# Patient Record
Sex: Female | Born: 1995 | Race: Black or African American | Hispanic: No | Marital: Single | Smoking: Never smoker
Health system: Southern US, Community
[De-identification: ages and names within clinical notes are randomized; demographics above are authoritative.]

## PROBLEM LIST (undated history)

## (undated) DIAGNOSIS — L0591 Pilonidal cyst without abscess: Secondary | ICD-10-CM

## (undated) DIAGNOSIS — Z8619 Personal history of other infectious and parasitic diseases: Secondary | ICD-10-CM

## (undated) DIAGNOSIS — A749 Chlamydial infection, unspecified: Secondary | ICD-10-CM

## (undated) HISTORY — DX: Personal history of other infectious and parasitic diseases: Z86.19

## (undated) HISTORY — DX: Pilonidal cyst without abscess: L05.91

## (undated) HISTORY — DX: Chlamydial infection, unspecified: A74.9

---
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
## 2017-06-01 ENCOUNTER — Ambulatory Visit (INDEPENDENT_AMBULATORY_CARE_PROVIDER_SITE_OTHER): Payer: BC Managed Care – PPO | Admitting: Obstetrics and Gynecology

## 2017-06-01 ENCOUNTER — Encounter: Payer: Self-pay | Admitting: Obstetrics and Gynecology

## 2017-06-01 VITALS — BP 109/75 | HR 75 | Ht 62.5 in | Wt 108.0 lb

## 2017-06-01 DIAGNOSIS — Z3009 Encounter for other general counseling and advice on contraception: Secondary | ICD-10-CM

## 2017-06-01 DIAGNOSIS — Z30011 Encounter for initial prescription of contraceptive pills: Secondary | ICD-10-CM

## 2017-06-01 MED ORDER — NORETHIN-ETH ESTRAD-FE BIPHAS 1 MG-10 MCG / 10 MCG PO TABS: 1.0000 | ORAL_TABLET | Freq: Every day | ORAL | 11 refills | Status: DC

## 2017-06-01 NOTE — Progress Notes (Signed)
21 yo G0 here for contraception counseling. Patient has been sexually active in the past but not currently. She has a history of chlamydia. She denies any pelvic pain or abnormal discharge  Past Medical History:  Diagnosis Date  . Ankle sprain   . History of chlamydia    History reviewed. No pertinent surgical history. Family History  Problem Relation Age of Onset  . Diabetes Paternal Grandfather   . Diabetes Paternal Grandmother   . Diabetes Maternal Grandmother    Social History  Substance Use Topics  . Smoking status: Never Smoker  . Smokeless tobacco: Never Used  . Alcohol use No   ROS See pertinent in HPI  Blood pressure 109/75, pulse 75, height 5' 2.5" (1.588 m), weight 108 lb (49 kg), last menstrual period 05/27/2017. GENERAL: Well-developed, well-nourished female in no acute distress.  NEURO: Alert and oriented x 3  A/P 21 yo here for contraception counseling - Discussed different birth control options - Patient opted for OCP. Sample for Lo Loestrin provided. Rx with refills also given - Advised patient to use condoms with every sexual encounter for STD prevention - pap smear at 41

## 2017-06-01 NOTE — Progress Notes (Signed)
Pt states she had +CH results from primary. Pt states was tx. Pt has f/u with primary this week for testing. Pt states that her primary has done routine exam for her this year.  Pt was advised to see our office in order to discuss birth control.

## 2017-06-01 NOTE — Patient Instructions (Signed)
Contraception Choices Contraception (birth control) is the use of any methods or devices to prevent pregnancy. Below are some methods to help avoid pregnancy. Hormonal methods  Contraceptive implant. This is a thin, plastic tube containing progesterone hormone. It does not contain estrogen hormone. Your health care provider inserts the tube in the inner part of the upper arm. The tube can remain in place for up to 3 years. After 3 years, the implant must be removed. The implant prevents the ovaries from releasing an egg (ovulation), thickens the cervical mucus to prevent sperm from entering the uterus, and thins the lining of the inside of the uterus.  Progesterone-only injections. These injections are given every 3 months by your health care provider to prevent pregnancy. This synthetic progesterone hormone stops the ovaries from releasing eggs. It also thickens cervical mucus and changes the uterine lining. This makes it harder for sperm to survive in the uterus.  Birth control pills. These pills contain estrogen and progesterone hormone. They work by preventing the ovaries from releasing eggs (ovulation). They also cause the cervical mucus to thicken, preventing the sperm from entering the uterus. Birth control pills are prescribed by a health care provider.Birth control pills can also be used to treat heavy periods.  Minipill. This type of birth control pill contains only the progesterone hormone. They are taken every day of each month and must be prescribed by your health care provider.  Birth control patch. The patch contains hormones similar to those in birth control pills. It must be changed once a week and is prescribed by a health care provider.  Vaginal ring. The ring contains hormones similar to those in birth control pills. It is left in the vagina for 3 weeks, removed for 1 week, and then a new one is put back in place. The patient must be comfortable inserting and removing the ring from  the vagina.A health care provider's prescription is necessary.  Emergency contraception. Emergency contraceptives prevent pregnancy after unprotected sexual intercourse. This pill can be taken right after sex or up to 5 days after unprotected sex. It is most effective the sooner you take the pills after having sexual intercourse. Most emergency contraceptive pills are available without a prescription. Check with your pharmacist. Do not use emergency contraception as your only form of birth control. Barrier methods  Female condom. This is a thin sheath (latex or rubber) that is worn over the penis during sexual intercourse. It can be used with spermicide to increase effectiveness.  Female condom. This is a soft, loose-fitting sheath that is put into the vagina before sexual intercourse.  Diaphragm. This is a soft, latex, dome-shaped barrier that must be fitted by a health care provider. It is inserted into the vagina, along with a spermicidal jelly. It is inserted before intercourse. The diaphragm should be left in the vagina for 6 to 8 hours after intercourse.  Cervical cap. This is a round, soft, latex or plastic cup that fits over the cervix and must be fitted by a health care provider. The cap can be left in place for up to 48 hours after intercourse.  Sponge. This is a soft, circular piece of polyurethane foam. The sponge has spermicide in it. It is inserted into the vagina after wetting it and before sexual intercourse.  Spermicides. These are chemicals that kill or block sperm from entering the cervix and uterus. They come in the form of creams, jellies, suppositories, foam, or tablets. They do not require a prescription. They   are inserted into the vagina with an applicator before having sexual intercourse. The process must be repeated every time you have sexual intercourse. Intrauterine contraception  Intrauterine device (IUD). This is a T-shaped device that is put in a woman's uterus during  a menstrual period to prevent pregnancy. There are 2 types: ? Copper IUD. This type of IUD is wrapped in copper wire and is placed inside the uterus. Copper makes the uterus and fallopian tubes produce a fluid that kills sperm. It can stay in place for 10 years. ? Hormone IUD. This type of IUD contains the hormone progestin (synthetic progesterone). The hormone thickens the cervical mucus and prevents sperm from entering the uterus, and it also thins the uterine lining to prevent implantation of a fertilized egg. The hormone can weaken or kill the sperm that get into the uterus. It can stay in place for 3-5 years, depending on which type of IUD is used. Permanent methods of contraception  Female tubal ligation. This is when the woman's fallopian tubes are surgically sealed, tied, or blocked to prevent the egg from traveling to the uterus.  Hysteroscopic sterilization. This involves placing a small coil or insert into each fallopian tube. Your doctor uses a technique called hysteroscopy to do the procedure. The device causes scar tissue to form. This results in permanent blockage of the fallopian tubes, so the sperm cannot fertilize the egg. It takes about 3 months after the procedure for the tubes to become blocked. You must use another form of birth control for these 3 months.  Female sterilization. This is when the female has the tubes that carry sperm tied off (vasectomy).This blocks sperm from entering the vagina during sexual intercourse. After the procedure, the man can still ejaculate fluid (semen). Natural planning methods  Natural family planning. This is not having sexual intercourse or using a barrier method (condom, diaphragm, cervical cap) on days the woman could become pregnant.  Calendar method. This is keeping track of the length of each menstrual cycle and identifying when you are fertile.  Ovulation method. This is avoiding sexual intercourse during ovulation.  Symptothermal method.  This is avoiding sexual intercourse during ovulation, using a thermometer and ovulation symptoms.  Post-ovulation method. This is timing sexual intercourse after you have ovulated. Regardless of which type or method of contraception you choose, it is important that you use condoms to protect against the transmission of sexually transmitted infections (STIs). Talk with your health care provider about which form of contraception is most appropriate for you. This information is not intended to replace advice given to you by your health care provider. Make sure you discuss any questions you have with your health care provider. Document Released: 12/08/2005 Document Revised: 05/15/2016 Document Reviewed: 06/02/2013 Elsevier Interactive Patient Education  2017 [employer].  

## 2017-06-03 DIAGNOSIS — A749 Chlamydial infection, unspecified: Secondary | ICD-10-CM

## 2017-06-03 HISTORY — DX: Chlamydial infection, unspecified: A74.9

## 2017-06-25 ENCOUNTER — Telehealth: Payer: Self-pay | Admitting: *Deleted

## 2017-06-25 NOTE — Telephone Encounter (Signed)
Pt came in to office today with questions about birth control.  Pt states that she has still been cramping with current pills.  Pt states that her cramping has been about the same if not worse at times since starting on BC pills. Pt also states that she has missed some pills in this pack and has started what seems to be a cycle. Pt advised of importance to take everyday in order to keep cycle regulated.  Pt advised to have cycle week this week, due to number of pills missed, and start new pack on Sunday.    Pt made aware that message will be sent to provider for any further recommendations.   Please advise.

## 2017-07-06 ENCOUNTER — Ambulatory Visit: Payer: BC Managed Care – PPO | Admitting: Obstetrics & Gynecology

## 2018-01-01 ENCOUNTER — Telehealth: Payer: Self-pay | Admitting: Family Medicine

## 2018-01-01 NOTE — Telephone Encounter (Signed)
Pt. Called wanting to schedule appointment with a new PCP. Appointment made with Dr. Juleen China. Reports she was recently seen and treated for pilonidal cyst.

## 2018-01-05 ENCOUNTER — Ambulatory Visit: Payer: BC Managed Care – PPO | Admitting: Family Medicine

## 2018-01-08 ENCOUNTER — Ambulatory Visit: Payer: BC Managed Care – PPO | Admitting: Family Medicine

## 2018-01-08 ENCOUNTER — Encounter: Payer: Self-pay | Admitting: Family Medicine

## 2018-01-08 VITALS — BP 104/68 | HR 79 | Temp 98.2°F | Ht 62.5 in | Wt 115.4 lb

## 2018-01-08 DIAGNOSIS — L0591 Pilonidal cyst without abscess: Secondary | ICD-10-CM

## 2018-01-08 MED ORDER — MUPIROCIN 2 % EX OINT: 1.0000 "application " | TOPICAL_OINTMENT | Freq: Three times a day (TID) | CUTANEOUS | 0 refills | Status: DC

## 2018-01-08 MED ORDER — SULFAMETHOXAZOLE-TRIMETHOPRIM 800-160 MG PO TABS
1.0000 | ORAL_TABLET | Freq: Two times a day (BID) | ORAL | 0 refills | Status: DC
Start: 2018-01-08 — End: 2019-01-05

## 2018-01-08 NOTE — Patient Instructions (Addendum)
Take antibiotic 1 tablet by mouth with food twice a day.  Apply ointment to area three times a day. Do not use any over the counter creams on the area until healed.

## 2018-01-10 ENCOUNTER — Encounter: Payer: Self-pay | Admitting: Family Medicine

## 2018-01-10 NOTE — Progress Notes (Signed)
Olivia Herrera is a 22 y.o. female is here to [facility].   Patient Care Team: Briscoe Deutscher, DO as PCP - General (Family Medicine)   History of Present Illness:   HPI: See Assessment and Plan section for Problem Based Charting of issues discussed today.  Health Maintenance Due  Topic Date Due  . HIV Screening  12/12/2013  . INFLUENZA VACCINE  07/22/2017   Depression screen PHQ 2/9 01/08/2018  Decreased Interest 0  Down, Depressed, Hopeless 0  PHQ - 2 Score 0   PMHx, SurgHx, SocialHx, Medications, and Allergies were reviewed in the Visit Navigator and updated as appropriate.   Past Medical History:  Diagnosis Date  . Ankle sprain   . History of chlamydia    History reviewed. No pertinent surgical history. Family History  Problem Relation Age of Onset  . Alcohol abuse Father   . Diabetes Paternal Grandfather   . Diabetes Paternal Grandmother   . Diabetes Maternal Grandmother    Social History   Tobacco Use  . Smoking status: Never Smoker  . Smokeless tobacco: Never Used  Substance Use Topics  . Alcohol use: No  . Drug use: No   Current Medications and Allergies:   .  Norethindrone-Ethinyl Estradiol-Fe Biphas (LO LOESTRIN FE) 1 MG-10 MCG / 10 MCG tablet, Take 1 tablet by mouth daily., Disp: 1 Package, Rfl: 11  No Known Allergies   Review of Systems:   Pertinent items are noted in the HPI. Otherwise, ROS is negative.  Vitals:   Vitals:   01/08/18 1250  BP: 104/68  Pulse: 79  Temp: 98.2 F (36.8 C)  TempSrc: Oral  SpO2: 100%  Weight: 115 lb 6.4 oz (52.3 kg)  Height: 5' 2.5" (1.588 m)     Body mass index is 20.77 kg/m.  Physical Exam:   Physical Exam  Constitutional: She is oriented to person, place, and time. She appears well-developed and well-nourished. No distress.  HENT:  Head: Normocephalic and atraumatic.  Right Ear: External ear normal.  Left Ear: External ear normal.  Nose: Nose normal.  Mouth/Throat: Oropharynx is clear and  moist.  Eyes: Conjunctivae and EOM are normal. Pupils are equal, round, and reactive to light.  Neck: Normal range of motion. Neck supple. No thyromegaly present.  Cardiovascular: Normal rate, regular rhythm, normal heart sounds and intact distal pulses.  Pulmonary/Chest: Effort normal and breath sounds normal.  Abdominal: Soft. Bowel sounds are normal.  Musculoskeletal: Normal range of motion.  Lymphadenopathy:    She has no cervical adenopathy.  Neurological: She is alert and oriented to person, place, and time.  Skin: Skin is warm and dry. Capillary refill takes less than 2 seconds.  Pilonidal cyst palpated, indurated, with skin changes c/w neosporin irritation. No draining, fluctuance, streaking.  Psychiatric: She has a normal mood and affect. Her behavior is normal.  Nursing note and vitals reviewed.  Assessment and Plan:   1. Infected pilonidal cyst Patient diagnosed with a pilonidal cyst two weeks ago. She was prescribed Septra. Noted some improvement with the antibiotic plus warm baths. She continues to have some discomfort and still notes an enlarged area at her coccyx. No longer draining. Finished septra. Otherwise, ROS is negative.   On exam, indurated area is still palpated. Overlying skin is irritated, possible from Neosporin used by patient. Will continue antibiotic x 7 days. Mupirocin provided. Continue moist heat to area. Red flags reviewed.    - mupirocin ointment (BACTROBAN) 2 %; Place 1 application into the  nose 3 (three) times daily.  Dispense: 22 g; Refill: 0 - sulfamethoxazole-trimethoprim (BACTRIM DS,SEPTRA DS) 800-160 MG tablet; Take 1 tablet by mouth 2 (two) times daily.  Dispense: 14 tablet; Refill: 0   . Reviewed expectations re: course of current medical issues. . Discussed self-management of symptoms. . Outlined signs and symptoms indicating need for more acute intervention. . Patient verbalized understanding and all questions were answered. Marland Kitchen Health  Maintenance issues including appropriate healthy diet, exercise, and smoking avoidance were discussed with patient. . See orders for this visit as documented in the electronic medical record. . Patient received an After Visit Summary.  Briscoe Deutscher, DO [place], [facility] [place] [facility] 01/10/2018

## 2018-01-12 ENCOUNTER — Ambulatory Visit: Payer: Self-pay | Admitting: *Deleted

## 2018-01-12 NOTE — Telephone Encounter (Signed)
Patient had headache on Saturday- noticed she was tired and her eyes were red- the next morning the headache was gone- but her eyes were still red. Patient reports her eyes are some better- not as red today. 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 She does not have drainage from her eyes- no itching.  Reason for Disposition . [1] Red eye AND [2] no blurred vision AND [3] minimal or no pain  Answer Assessment - Initial Assessment Questions 1. LOCATION: Location: "What's red, the eyeball or the outer eyelids?" (Note: when callers say the eye is red, they usually mean the sclera is red)       Sclera- white area of eye 2. REDNESS OF SCLERA: "Is the redness in one or both eyes?" "When did the redness start?"      Both eyes- Saturday 3. ONSET: "When did the eye become red?" (e.g., hours, days)      Saturday  4. EYELIDS: "Are the eyelids red or swollen?" If so, ask: "How much?"      no 5. VISION: "Is there any difficulty seeing clearly?"      No- patient feels like eyes are fatigued 6. ITCHING: "Does it feel itchy?" If so ask: "How bad is it" (e.g., Scale 1-10; or mild, moderate, severe)     No- more irritated/strained 7. PAIN: "Is there any pain? If so, ask: "How bad is it?" (e.g., Scale 1-10; or mild, moderate, severe)     no 8. CONTACT LENS: "Do you wear contacts?"     no 9. CAUSE: "What do you think is causing the redness?"     Patient thinks she may have more eye strain than anything 10. OTHER SYMPTOMS: "Do you have any other symptoms?" (e.g., fever, runny nose, cough, vomiting)       No- patient works outside  Protocols used: EYE - RED WITHOUT PUS-A-AH

## 2018-01-14 ENCOUNTER — Ambulatory Visit: Payer: BC Managed Care – PPO | Admitting: Family Medicine

## 2018-01-14 NOTE — Progress Notes (Deleted)
   Olivia Herrera is a 22 y.o. female here for an acute visit.  History of Present Illness:   HPI:   PMHx, SurgHx, SocialHx, Medications, and Allergies were reviewed in the Visit Navigator and updated as appropriate.  Current Medications:   .  Norethindrone-Ethinyl Estradiol-Fe Biphas (LO LOESTRIN FE) 1 MG-10 MCG / 10 MCG tablet, Take 1 tablet by mouth daily., Disp: 1 Package, Rfl: 11  No Known Allergies   Review of Systems:   Pertinent items are noted in the HPI. Otherwise, ROS is negative.  Vitals:  There were no vitals filed for this visit.   There is no height or weight on file to calculate BMI.  Physical Exam:   Physical Exam   Assessment and Plan:   ***

## 2018-01-18 ENCOUNTER — Encounter: Payer: Self-pay | Admitting: Family Medicine

## 2018-04-20 ENCOUNTER — Other Ambulatory Visit: Payer: Self-pay | Admitting: Obstetrics and Gynecology

## 2019-01-05 ENCOUNTER — Ambulatory Visit: Payer: BC Managed Care – PPO | Admitting: Family Medicine

## 2019-01-05 ENCOUNTER — Encounter: Payer: Self-pay | Admitting: Family Medicine

## 2019-01-05 VITALS — BP 110/64 | HR 78 | Temp 98.4°F | Ht 62.5 in | Wt 115.6 lb

## 2019-01-05 DIAGNOSIS — B373 Candidiasis of vulva and vagina: Secondary | ICD-10-CM

## 2019-01-05 DIAGNOSIS — B3731 Acute candidiasis of vulva and vagina: Secondary | ICD-10-CM

## 2019-01-05 MED ORDER — FLUCONAZOLE 150 MG PO TABS: 150.0000 mg | ORAL_TABLET | Freq: Once | ORAL | 0 refills | Status: AC

## 2019-01-05 NOTE — Progress Notes (Signed)
   Olivia Herrera is a 23 y.o. female here for an acute visit.  History of Present Illness:   Lonell Grandchild, CMA acting as scribe for Dr. Briscoe Deutscher.   HPI: Patent in for evaluation for vaginal itching for three days. She finished treatment for BV on 12/14/18. She was tested and treated and tested at the [facility]. She started having vaginal itching with brown to yellow discharge and odor about three days ago. She has had unprotected sex after treatment.   PMHx, SurgHx, SocialHx, Medications, and Allergies were reviewed in the Visit Navigator and updated as appropriate.  Current Medications:  No current outpatient medications on file.   No Known Allergies Review of Systems:   Pertinent items are noted in the HPI. Otherwise, ROS is negative.  Vitals:   Vitals:   01/05/19 1131  BP: 110/64  Pulse: 78  Temp: 98.4 F (36.9 C)  TempSrc: Oral  SpO2: 100%  Weight: 115 lb 9.6 oz (52.4 kg)  Height: 5' 2.5" (1.588 m)     Body mass index is 20.81 kg/m.  Physical Exam:   Physical Exam Vitals signs and nursing note reviewed.  HENT:     Head: Normocephalic and atraumatic.  Eyes:     Pupils: Pupils are equal, round, and reactive to light.  Neck:     Musculoskeletal: Normal range of motion and neck supple.  Cardiovascular:     Rate and Rhythm: Normal rate and regular rhythm.     Heart sounds: Normal heart sounds.  Pulmonary:     Effort: Pulmonary effort is normal.  Abdominal:     Palpations: Abdomen is soft.  Skin:    General: Skin is warm.  Psychiatric:        Behavior: Behavior normal.    Assessment and Plan:   Thi was seen today for vaginal itching.  Diagnoses and all orders for this visit:  Candidal vaginitis Comments: Recent Abx use for BV. With white DC. Partner negative for STIs yesterday.  Orders: -     fluconazole (DIFLUCAN) 150 MG tablet; Take 1 tablet (150 mg total) by mouth once for 1 dose. 1 po x 1, then again 3 days later   . Reviewed  expectations re: course of current medical issues. . Discussed self-management of symptoms. . Outlined signs and symptoms indicating need for more acute intervention. . Patient verbalized understanding and all questions were answered. Marland Kitchen Health Maintenance issues including appropriate healthy diet, exercise, and smoking avoidance were discussed with patient. . See orders for this visit as documented in the electronic medical record. . Patient received an After Visit Summary.  CMA served as Education administrator during this visit. History, Physical, and Plan performed by medical provider. The above documentation has been reviewed and is accurate and complete. Briscoe Deutscher, D.O.  Briscoe Deutscher, DO [place], [facility] [place] 01/06/2019

## 2019-01-06 ENCOUNTER — Encounter: Payer: Self-pay | Admitting: Family Medicine

## 2019-01-21 ENCOUNTER — Encounter: Payer: Self-pay | Admitting: Physician Assistant

## 2019-01-21 ENCOUNTER — Other Ambulatory Visit: Payer: Self-pay

## 2019-01-21 ENCOUNTER — Ambulatory Visit: Payer: BC Managed Care – PPO | Admitting: Physician Assistant

## 2019-01-21 ENCOUNTER — Ambulatory Visit: Payer: BC Managed Care – PPO | Admitting: Family Medicine

## 2019-01-21 VITALS — BP 98/70 | HR 78 | Temp 98.4°F | Resp 14 | Ht 62.5 in | Wt 117.0 lb

## 2019-01-21 DIAGNOSIS — J069 Acute upper respiratory infection, unspecified: Secondary | ICD-10-CM

## 2019-01-21 DIAGNOSIS — B9789 Other viral agents as the cause of diseases classified elsewhere: Secondary | ICD-10-CM

## 2019-01-21 MED ORDER — CETIRIZINE-PSEUDOEPHEDRINE ER 5-120 MG PO TB12: 1.0000 | ORAL_TABLET | Freq: Two times a day (BID) | ORAL | 0 refills | Status: DC

## 2019-01-21 MED ORDER — BENZONATATE 100 MG PO CAPS: 100.0000 mg | ORAL_CAPSULE | Freq: Three times a day (TID) | ORAL | 0 refills | Status: DC | PRN

## 2019-01-21 NOTE — Progress Notes (Signed)
Patient presents to clinic today c/o 2 days of rhinorrhea, cough, sore throat, headache and chest congestion. Denies ear pain and tooth pain. Endorses feeling feverish at night but has not checked temperature.. Has taken Zyrtec and Halls cough drops to help with symptoms. Has also taken some Aleve. 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   Past Medical History:  Diagnosis Date  . Chlamydia infection 06/03/2017  . History of chlamydia   . Pilonidal cyst     Current Outpatient Medications on File Prior to Visit  Medication Sig Dispense Refill  . cetirizine (ZYRTEC) 10 MG tablet Take 10 mg by mouth daily.     No current facility-administered medications on file prior to visit.     No Known Allergies  Family History  Problem Relation Age of Onset  . Alcohol abuse Father   . Diabetes Paternal Grandfather   . Diabetes Paternal Grandmother   . Diabetes Maternal Grandmother     Social History   Socioeconomic History  . Marital status: Single    Spouse name: n/a  . Number of children: 0  . Years of education: Not on file  . Highest education level: Not on file  Occupational History  . Occupation: Ship broker    Comment: [employer]  Social Needs  . Financial resource strain: Not on file  . Food insecurity:    Worry: Not on file    Inability: Not on file  . Transportation needs:    Medical: Not on file    Non-medical: Not on file  Tobacco Use  . Smoking status: Never Smoker  . Smokeless tobacco: Never Used  Substance and Sexual Activity  . Alcohol use: No  . Drug use: No  . Sexual activity: Not Currently    Birth control/protection: None  Lifestyle  . Physical activity:    Days per week: Not on file    Minutes per session: Not on file  . Stress: Not on file  Relationships  . Social connections:    Talks on phone: Not on file    Gets together: Not on file    Attends religious service: Not on file    Active member  of club or organization: Not on file    Attends meetings of clubs or organizations: Not on file    Relationship status: Not on file  Other Topics Concern  . 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    Review of Systems - See HPI.  All other ROS are negative.  BP 98/70   Pulse 78   Temp 98.4 F (36.9 C) (Oral)   Resp 14   Ht 5' 2.5" (1.588 m)   Wt 117 lb (53.1 kg)   SpO2 98%   BMI 21.06 kg/m   Physical Exam Vitals signs and nursing note reviewed.  HENT:     Head: Normocephalic and atraumatic.     Nose: Congestion and rhinorrhea present.     Mouth/Throat:     Mouth: Mucous membranes are moist.     Pharynx: Oropharynx is clear.  Eyes:     Conjunctiva/sclera: Conjunctivae normal.  Neck:     Musculoskeletal: Neck supple.  Cardiovascular:     Rate and Rhythm: Normal rate and regular rhythm.     Pulses: Normal pulses.     Heart sounds: Normal heart sounds.  Pulmonary:     Effort: Pulmonary  effort is normal.  Neurological:     Mental Status: She is alert.    Assessment/Plan: 1. Viral URI with cough Lungs CTAB. Vitals stable. Exam overall unremarkable except for rhinorrhea and nasal congestion. No TTP of sinuses on exam. Supportive measures and OTC medications reviewed. Rx Tessalon and Zyrtec-D.   - benzonatate (TESSALON) 100 MG capsule; Take 1 capsule (100 mg total) by mouth 3 (three) times daily as needed for cough.  Dispense: 30 capsule; Refill: 0 - cetirizine-pseudoephedrine (ZYRTEC-D ALLERGY & CONGESTION) 5-120 MG tablet; Take 1 tablet by mouth 2 (two) times daily.  Dispense: 30 tablet; Refill: 0   Leeanne Rio, PA-C

## 2019-01-21 NOTE — Patient Instructions (Signed)
We are sorry that you are not feeling well.  Here is how we plan to help!  Based on your presentation I believe you most likely have A cough due to a virus.  This is called viral bronchitis and is best treated by rest, plenty of fluids and control of the cough.  You may use Ibuprofen or Tylenol as directed to help your symptoms.     In addition you may use A prescription cough medication called Tessalon Perles 100mg . You may take 1-2 capsules every 8 hours as needed for your cough.   I also want you to stop your plain Zyrtec and start the Zyrtec-D I have sent in for you.  HOME CARE . Only take medications as instructed by your medical team. . Complete the entire course of an antibiotic. . Drink plenty of fluids and get plenty of rest. . Avoid close contacts especially the very young and the elderly . Cover your mouth if you cough or cough into your sleeve. . Always remember to wash your hands . A steam or ultrasonic humidifier can help congestion.   GET HELP RIGHT AWAY IF: . You develop worsening fever. . You become short of breath . You cough up blood. . Your symptoms persist after you have completed your treatment plan MAKE SURE YOU   Understand these instructions.  Will watch your condition.  Will get help right away if you are not doing well or get worse.

## 2019-01-28 ENCOUNTER — Ambulatory Visit: Payer: BC Managed Care – PPO | Admitting: Family Medicine

## 2019-01-28 ENCOUNTER — Other Ambulatory Visit: Payer: Self-pay

## 2019-01-28 ENCOUNTER — Encounter: Payer: Self-pay | Admitting: Family Medicine

## 2019-01-28 VITALS — BP 90/62 | HR 112 | Temp 99.3°F | Resp 14 | Ht 62.5 in | Wt 115.4 lb

## 2019-01-28 DIAGNOSIS — R399 Unspecified symptoms and signs involving the genitourinary system: Secondary | ICD-10-CM

## 2019-01-28 DIAGNOSIS — N3000 Acute cystitis without hematuria: Secondary | ICD-10-CM | POA: Diagnosis not present

## 2019-01-28 LAB — POCT URINALYSIS DIPSTICK
Bilirubin, UA: NEGATIVE
GLUCOSE UA: NEGATIVE
PROTEIN UA: POSITIVE — AB
SPEC GRAV UA: 1.02 (ref 1.010–1.025)
Urobilinogen, UA: 0.2 E.U./dL
pH, UA: 6.5 (ref 5.0–8.0)

## 2019-01-28 MED ORDER — FLUCONAZOLE 150 MG PO TABS: ORAL_TABLET | ORAL | 0 refills | Status: DC

## 2019-01-28 MED ORDER — NITROFURANTOIN MONOHYD MACRO 100 MG PO CAPS: 100.0000 mg | ORAL_CAPSULE | Freq: Two times a day (BID) | ORAL | 0 refills | Status: DC

## 2019-01-28 NOTE — Progress Notes (Signed)
Subjective   CC:  Chief Complaint  Patient presents with  . Urinary Tract Infection    Discomfort after urination. symptoms started yesterday    HPI: Olivia Herrera is a 23 y.o. female who presents to the office today to address the problems listed above in the chief complaint.  Patient reports dysuria and urinary frequency.  She has sensation of increased urinary pressure.  She denies fevers flank pain nausea vomiting or gross hematuria.  Symptoms have been present since yesterday. She has been in class today and has very little to drink or eat. She recently was treated for BV and then a secondary yeast infection. Those vaginal sxs have resolved. She denies history of interstitial cystitis.  She denies vaginal symptoms including vaginal discharge or pelvic pain. Her temp is mildly elevated but she is in a thick winter coat. She has not noted a fever.   Assessment  1. Acute cystitis without hematuria   2. UTI symptoms      Plan   Acute cystitis: education given. Start abx and check culture. Diflucan given if needed. Discussed red flags: fever or flank pain. Current tachy and temp of 99 due to bundled in thick winter coat and mild dehydration.   Follow up: No follow-ups on file.  Orders Placed This Encounter  Procedures  . Urine Culture  . POCT urinalysis dipstick   Meds ordered this encounter  Medications  . nitrofurantoin, macrocrystal-monohydrate, (MACROBID) 100 MG capsule    Sig: Take 1 capsule (100 mg total) by mouth 2 (two) times daily.    Dispense:  10 capsule    Refill:  0  . fluconazole (DIFLUCAN) 150 MG tablet    Sig: Take one tablet today; may repeat in 3 days if symptoms persist    Dispense:  2 tablet    Refill:  0      I reviewed the patients updated PMH, FH, and SocHx.    There are no active problems to display for this patient.  No outpatient medications have been marked as taking for the 01/28/19 encounter (Office Visit) with Leamon Arnt, MD.     Review of Systems: Cardiovascular: negative for chest pain Respiratory: negative for SOB or persistent cough Gastrointestinal: negative for abdominal pain Constitutional: Negative for fever malaise or anorexia  Objective  Vitals: BP 90/62   Pulse (!) 112   Temp 99.3 F (37.4 C) (Oral)   Resp 14   Ht 5' 2.5" (1.588 m)   Wt 115 lb 6.4 oz (52.3 kg)   LMP 01/12/2019   SpO2 98%   BMI 20.77 kg/m  General: no acute distress  Psych:  Alert and oriented, normal mood and affect Cardiovascular:  RRR without murmur or gallop. no peripheral edema Respiratory:  Good breath sounds bilaterally, CTAB with normal respiratory effort Gastrointestinal: soft, flat abdomen, normal active bowel sounds, no palpable masses, no hepatosplenomegaly, no appreciated hernias, NO CVAT, mild suprapubic ttp w/o rebound or guarding Skin:  Warm, no rashes Neurologic:   Mental status is normal. normal gait Office Visit on 01/28/2019  Component Date Value Ref Range Status  . Color, UA 01/28/2019 Dark   Final  . Clarity, UA 01/28/2019 Cloudy   Final  . Glucose, UA 01/28/2019 Negative  Negative Final  . Bilirubin, UA 01/28/2019 Negative   Final  . Ketones, UA 01/28/2019 +   Final  . Spec Grav, UA 01/28/2019 1.020  1.010 - 1.025 Final  . Blood, UA 01/28/2019 Trace   Final  .  pH, UA 01/28/2019 6.5  5.0 - 8.0 Final  . Protein, UA 01/28/2019 Positive* Negative Final  . Urobilinogen, UA 01/28/2019 0.2  0.2 or 1.0 E.U./dL Final  . Nitrite, UA 01/28/2019 +   Final  . Leukocytes, UA 01/28/2019 Small (1+)* Negative Final  . Odor 01/28/2019 Yes   Final    Commons side effects, risks, benefits, and alternatives for medications and treatment plan prescribed today were discussed, and the patient expressed understanding of the given instructions. Patient is instructed to call or message via MyChart if he/she has any questions or concerns regarding our treatment plan. No barriers to understanding were identified. We  discussed Red Flag symptoms and signs in detail. Patient expressed understanding regarding what to do in case of urgent or emergency type symptoms.   Medication list was reconciled, printed and provided to the patient in AVS. Patient instructions and summary information was reviewed with the patient as documented in the AVS. This note was prepared with assistance of Dragon voice recognition software. Occasional wrong-word or sound-a-like substitutions may have occurred due to the inherent limitations of voice recognition software

## 2019-01-28 NOTE — Patient Instructions (Signed)
Please follow up if symptoms do not improve or as needed.    Urinary Tract Infection, Adult  A urinary tract infection (UTI) is an infection of any part of the urinary tract. The urinary tract includes the kidneys, ureters, bladder, and urethra. These organs make, store, and get rid of urine in the body. Your health care provider may use other names to describe the infection. An upper UTI affects the ureters and kidneys (pyelonephritis). A lower UTI affects the bladder (cystitis) and urethra (urethritis). What are the causes? Most urinary tract infections are caused by bacteria in your genital area, around the entrance to your urinary tract (urethra). These bacteria grow and cause inflammation of your urinary tract. What increases the risk? You are more likely to develop this condition if:  You have a urinary catheter that stays in place (indwelling).  You are not able to control when you urinate or have a bowel movement (you have incontinence).  You are female and you: ? Use a spermicide or diaphragm for birth control. ? Have low estrogen levels. ? Are pregnant.  You have certain genes that increase your risk (genetics).  You are sexually active.  You take antibiotic medicines.  You have a condition that causes your flow of urine to slow down, such as: ? An enlarged prostate, if you are female. ? Blockage in your urethra (stricture). ? A kidney stone. ? A nerve condition that affects your bladder control (neurogenic bladder). ? Not getting enough to drink, or not urinating often.  You have certain medical conditions, such as: ? Diabetes. ? A weak disease-fighting system (immunesystem). ? Sickle cell disease. ? Gout. ? Spinal cord injury. What are the signs or symptoms? Symptoms of this condition include:  Needing to urinate right away (urgently).  Frequent urination or passing small amounts of urine frequently.  Pain or burning with urination.  Blood in the  urine.  Urine that smells bad or unusual.  Trouble urinating.  Cloudy urine.  Vaginal discharge, if you are female.  Pain in the abdomen or the lower back. You may also have:  Vomiting or a decreased appetite.  Confusion.  Irritability or tiredness.  A fever.  Diarrhea. The first symptom in older adults may be confusion. In some cases, they may not have any symptoms until the infection has worsened. How is this diagnosed? This condition is diagnosed based on your medical history and a physical exam. You may also have other tests, including:  Urine tests.  Blood tests.  Tests for sexually transmitted infections (STIs). If you have had more than one UTI, a cystoscopy or imaging studies may be done to determine the cause of the infections. How is this treated? Treatment for this condition includes:  Antibiotic medicine.  Over-the-counter medicines to treat discomfort.  Drinking enough water to stay hydrated. If you have frequent infections or have other conditions such as a kidney stone, you may need to see a health care provider who specializes in the urinary tract (urologist). In rare cases, urinary tract infections can cause sepsis. Sepsis is a life-threatening condition that occurs when the body responds to an infection. Sepsis is treated in the hospital with IV antibiotics, fluids, and other medicines. Follow these instructions at home:  Medicines  Take over-the-counter and prescription medicines only as told by your health care provider.  If you were prescribed an antibiotic medicine, take it as told by your health care provider. Do not stop using the antibiotic even if you start to  feel better. General instructions  Make sure you: ? Empty your bladder often and completely. Do not hold urine for long periods of time. ? Empty your bladder after sex. ? Wipe from front to back after a bowel movement if you are female. Use each tissue one time when you  wipe.  Drink enough fluid to keep your urine pale yellow.  Keep all follow-up visits as told by your health care provider. This is important. Contact a health care provider if:  Your symptoms do not get better after 1-2 days.  Your symptoms go away and then return. Get help right away if you have:  Severe pain in your back or your lower abdomen.  A fever.  Nausea or vomiting. Summary  A urinary tract infection (UTI) is an infection of any part of the urinary tract, which includes the kidneys, ureters, bladder, and urethra.  Most urinary tract infections are caused by bacteria in your genital area, around the entrance to your urinary tract (urethra).  Treatment for this condition often includes antibiotic medicines.  If you were prescribed an antibiotic medicine, take it as told by your health care provider. Do not stop using the antibiotic even if you start to feel better.  Keep all follow-up visits as told by your health care provider. This is important. This information is not intended to replace advice given to you by your health care provider. Make sure you discuss any questions you have with your health care provider. Document Released: 09/17/2005 Document Revised: 06/17/2018 Document Reviewed: 06/17/2018 Elsevier Interactive Patient Education  2019 [employer].

## 2019-01-30 LAB — URINE CULTURE
MICRO NUMBER: 168114
SPECIMEN QUALITY:: ADEQUATE

## 2019-01-31 NOTE — Progress Notes (Signed)
+   UTI sensitive to abx used. No further action needed

## 2019-02-03 ENCOUNTER — Emergency Department (HOSPITAL_COMMUNITY)
Admission: EM | Admit: 2019-02-03 | Discharge: 2019-02-03 | Disposition: A | Discharge status: Home / Self Care | Payer: BC Managed Care – PPO | Attending: Emergency Medicine | Admitting: Emergency Medicine

## 2019-02-03 ENCOUNTER — Other Ambulatory Visit: Payer: Self-pay

## 2019-02-03 ENCOUNTER — Encounter (HOSPITAL_COMMUNITY): Payer: Self-pay | Admitting: *Deleted

## 2019-02-03 DIAGNOSIS — Y92410 Unspecified street and highway as the place of occurrence of the external cause: Secondary | ICD-10-CM | POA: Insufficient documentation

## 2019-02-03 DIAGNOSIS — Y999 Unspecified external cause status: Secondary | ICD-10-CM | POA: Diagnosis not present

## 2019-02-03 DIAGNOSIS — M62830 Muscle spasm of back: Secondary | ICD-10-CM | POA: Insufficient documentation

## 2019-02-03 DIAGNOSIS — Y939 Activity, unspecified: Secondary | ICD-10-CM | POA: Insufficient documentation

## 2019-02-03 DIAGNOSIS — S5001XA Contusion of right elbow, initial encounter: Secondary | ICD-10-CM | POA: Diagnosis not present

## 2019-02-03 DIAGNOSIS — Z79899 Other long term (current) drug therapy: Secondary | ICD-10-CM | POA: Diagnosis not present

## 2019-02-03 DIAGNOSIS — S3992XA Unspecified injury of lower back, initial encounter: Secondary | ICD-10-CM | POA: Diagnosis present

## 2019-02-03 MED ORDER — CYCLOBENZAPRINE HCL 5 MG PO TABS: 5.0000 mg | ORAL_TABLET | Freq: Two times a day (BID) | ORAL | 0 refills | Status: DC | PRN

## 2019-02-03 MED ORDER — NAPROXEN 375 MG PO TABS: 375.0000 mg | ORAL_TABLET | Freq: Two times a day (BID) | ORAL | 0 refills | Status: DC

## 2019-02-03 MED ORDER — IBUPROFEN 200 MG PO TABS
400.0000 mg | ORAL_TABLET | Freq: Once | ORAL | Status: AC
  Administered 2019-02-03: 400 mg via ORAL
  Filled 2019-02-03: qty 2

## 2019-02-03 MED ORDER — CYCLOBENZAPRINE HCL 10 MG PO TABS
5.0000 mg | ORAL_TABLET | Freq: Once | ORAL | Status: AC
  Administered 2019-02-03: 5 mg via ORAL
  Filled 2019-02-03: qty 1
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
## 2019-02-03 NOTE — Discharge Instructions (Signed)
Do not take the muscle relaxer if driving as it will make you sleepy. Follow up with your doctor or return here for worsening symptoms.
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
## 2019-02-28 ENCOUNTER — Ambulatory Visit: Payer: Self-pay | Admitting: Family Medicine

## 2019-02-28 ENCOUNTER — Ambulatory Visit: Payer: BC Managed Care – PPO | Admitting: Family Medicine

## 2019-02-28 ENCOUNTER — Encounter: Payer: Self-pay | Admitting: Family Medicine

## 2019-02-28 VITALS — BP 98/64 | HR 120 | Temp 100.8°F | Ht 62.5 in | Wt 114.8 lb

## 2019-02-28 DIAGNOSIS — J039 Acute tonsillitis, unspecified: Secondary | ICD-10-CM

## 2019-02-28 LAB — POC INFLUENZA A&B (BINAX/QUICKVUE)
Influenza A, POC: NEGATIVE
Influenza B, POC: NEGATIVE

## 2019-02-28 LAB — POCT RAPID STREP A (OFFICE): Rapid Strep A Screen: NEGATIVE

## 2019-02-28 LAB — POCT MONO (EPSTEIN BARR VIRUS): Mono, POC: NEGATIVE

## 2019-02-28 MED ORDER — AMOXICILLIN 875 MG PO TABS: 875.0000 mg | ORAL_TABLET | Freq: Two times a day (BID) | ORAL | 0 refills | Status: DC

## 2019-02-28 MED ORDER — PREDNISONE 5 MG PO TABS: ORAL_TABLET | ORAL | 0 refills | Status: DC

## 2019-02-28 NOTE — Progress Notes (Signed)
Olivia Herrera is a 23 y.o. female here for an acute visit.  History of Present Illness:   Lonell Grandchild, CMA acting as scribe for Dr. Briscoe Deutscher.   Sore Throat   This is a new problem. The current episode started yesterday. The problem has been rapidly worsening. The pain is worse on the left side. The maximum temperature recorded prior to her arrival was 100.4 - 100.9 F. The fever has been present for less than 1 day. The pain is at a severity of 7/10. The pain is severe. Associated symptoms include ear pain, headaches and a hoarse voice. Pertinent negatives include no abdominal pain, coughing, diarrhea or vomiting. Associated symptoms comments: Left ear pain . She has had no exposure to strep or mono. She has tried cool liquids, NSAIDs and acetaminophen for the symptoms. The treatment provided no relief.   PMHx, SurgHx, SocialHx, Medications, and Allergies were reviewed in the Visit Navigator and updated as appropriate.  Current Medications   .  cetirizine (ZYRTEC) 10 MG tablet, Take 10 mg by mouth daily., Disp: , Rfl:  .  cetirizine-pseudoephedrine (ZYRTEC-D ALLERGY & CONGESTION) 5-120 MG tablet, Take 1 tablet by mouth 2 (two) times daily., Disp: 30 tablet, Rfl: 0 .  cyclobenzaprine (FLEXERIL) 5 MG tablet, Take 1 tablet (5 mg total) by mouth 2 (two) times daily as needed for muscle spasms., Disp: 15 tablet, Rfl: 0 .  naproxen (NAPROSYN) 375 MG tablet, Take 1 tablet (375 mg total) by mouth 2 (two) times daily., Disp: 20 tablet, Rfl: 0   No Known Allergies   Review of Systems   Pertinent items are noted in the HPI. Otherwise, ROS is negative.  Vitals   Vitals:   02/28/19 1116  BP: 98/64  Pulse: (!) 120  Temp: (!) 100.8 F (38.2 C)  TempSrc: Oral  SpO2: 98%  Weight: 114 lb 12.8 oz (52.1 kg)  Height: 5' 2.5" (1.588 m)     Body mass index is 20.66 kg/m.  Physical Exam   Physical Exam Vitals signs and nursing note reviewed.  HENT:     Head: Normocephalic and  atraumatic.     Mouth/Throat:     Pharynx: Posterior oropharyngeal erythema present.     Tonsils: Tonsillar exudate present. No tonsillar abscesses. Swelling: 1+ on the right. 2+ on the left.  Eyes:     Pupils: Pupils are equal, round, and reactive to light.  Neck:     Musculoskeletal: Normal range of motion and neck supple.  Cardiovascular:     Rate and Rhythm: Normal rate and regular rhythm.     Heart sounds: Normal heart sounds.  Pulmonary:     Effort: Pulmonary effort is normal.  Abdominal:     Palpations: Abdomen is soft.  Skin:    General: Skin is warm.  Psychiatric:        Behavior: Behavior normal.    Results for orders placed or performed in visit on 02/28/19  POC Influenza A&B(BINAX/QUICKVUE)  Result Value Ref Range   Influenza A, POC Negative Negative   Influenza B, POC Negative Negative  POCT rapid strep A  Result Value Ref Range   Rapid Strep A Screen Negative Negative  POCT Mono (Epstein Barr Virus)  Result Value Ref Range   Mono, POC Negative Negative    Assessment and Plan   Shanyiah was seen today for sore throat.  Diagnoses and all orders for this visit:  Exudative tonsillitis -     POC Influenza A&B(BINAX/QUICKVUE) -  POCT rapid strep A -     POCT Mono (Epstein Barr Virus) -     predniSONE (DELTASONE) 5 MG tablet; 6-5-4-3-2-1-off -     amoxicillin (AMOXIL) 875 MG tablet; Take 1 tablet (875 mg total) by mouth 2 (two) times daily.   . Reviewed expectations re: course of current medical issues. . Discussed self-management of symptoms. . Outlined signs and symptoms indicating need for more acute intervention. . Patient verbalized understanding and all questions were answered. Marland Kitchen Health Maintenance issues including appropriate healthy diet, exercise, and smoking avoidance were discussed with patient. . See orders for this visit as documented in the electronic medical record. . Patient received an After Visit Summary.  CMA served as Education administrator during  this visit. History, Physical, and Plan performed by medical provider. The above documentation has been reviewed and is accurate and complete. Briscoe Deutscher, D.O.  Briscoe Deutscher, DO [place], [facility] 02/28/2019

## 2019-02-28 NOTE — Telephone Encounter (Signed)
See note. Patient is seeing Dr. Juleen China today

## 2019-02-28 NOTE — Telephone Encounter (Signed)
Has app today  °

## 2019-02-28 NOTE — Telephone Encounter (Signed)
Outgoing call to Patient  Who  Complains  Of  Sore throat.  Onset was yesterday when she woke up. Rate the pain moderate. Not sure if she has been exposed to anyone with strep throat.  Denies  Cough, runny nose, red eyes. Patient does see white patches on tonsils.  States back of throat is red.  Had a little headache yesterday.   Reports LMP 20 of Feb.                                                                                                                                                                                                                   Reason for Disposition . Earache also present  Answer Assessment - Initial Assessment Questions 1. ONSET: "When did the throat start hurting?" (Hours or days ago)      Yesterday  When I  Woke up.   2. SEVERITY: "How bad is the sore throat?" (Scale 1-10; mild, moderate or severe)   - MILD (1-3):  doesn't interfere with eating or normal activities   - MODERATE (4-7): interferes with eating some solids and normal activities   - SEVERE (8-10):  excruciating pain, interferes with most normal activities   - SEVERE DYSPHAGIA: can't swallow liquids, drooling     7 3. STREP EXPOSURE: "Has there been any exposure to strep within the past week?" If so, ask: "What type of contact occurred?"      Not sure 4.  VIRAL SYMPTOMS: "Are there any symptoms of a cold, such as a runny nose, cough, hoarse voice or red eyes?"      Denies cough runny nose 5. FEVER: "Do you have a fever?" If so, ask: "What is your temperature, how was it measured, and when did it start?"     Yesterday  A little 6. PUS ON THE TONSILS: "Is there pus on the tonsils in the back of your throat?"     *No Answer* 7. OTHER SYMPTOMS: "Do you have any other symptoms?" (e.g., difficulty breathing, headache, rash)     Little headach yesterday 8. PREGNANCY: "Is there any chance you are pregnant?" "When was your last menstrual period?"     20 of  feb  Protocols used: SORE THROAT-A-AH

## 2019-04-04 ENCOUNTER — Other Ambulatory Visit: Payer: Self-pay | Admitting: Family Medicine

## 2019-04-04 NOTE — Telephone Encounter (Signed)
See note

## 2019-04-04 NOTE — Telephone Encounter (Signed)
Copied from [place] [id number]. Topic: Quick Communication - Rx Refill/Question >> Apr 04, 2019  3:33 PM Leward Quan A wrote: Medication: cetirizine (ZYRTEC) 10 MG tablet   Has the patient contacted their pharmacy? Yes (Agent: If no, request that the patient contact the pharmacy for the refill.) (Agent: If yes, when and what did the pharmacy advise?)  Preferred Pharmacy (with phone number or street name): [id number] - , [place] [phone] (Phone) [phone] (Fax)    Agent: Please be advised that RX refills may take up to 3 business days. We ask that you follow-up with your pharmacy.

## 2019-04-05 MED ORDER — CETIRIZINE HCL 10 MG PO TABS: 10.0000 mg | ORAL_TABLET | Freq: Every day | ORAL | 0 refills | Status: DC

## 2019-07-11 ENCOUNTER — Encounter: Payer: Self-pay | Admitting: Family Medicine

## 2019-07-12 ENCOUNTER — Encounter: Payer: Self-pay | Admitting: Family Medicine

## 2019-07-12 ENCOUNTER — Ambulatory Visit (INDEPENDENT_AMBULATORY_CARE_PROVIDER_SITE_OTHER): Payer: BC Managed Care – PPO | Admitting: Family Medicine

## 2019-07-12 VITALS — Ht 62.5 in

## 2019-07-12 DIAGNOSIS — L0591 Pilonidal cyst without abscess: Secondary | ICD-10-CM | POA: Diagnosis not present

## 2019-07-12 MED ORDER — MUPIROCIN 2 % EX OINT: 1.0000 "application " | TOPICAL_OINTMENT | Freq: Two times a day (BID) | CUTANEOUS | 0 refills | Status: DC

## 2019-07-12 MED ORDER — SULFAMETHOXAZOLE-TRIMETHOPRIM 400-80 MG PO TABS: 1.0000 | ORAL_TABLET | Freq: Two times a day (BID) | ORAL | 0 refills | Status: DC

## 2019-07-12 NOTE — Progress Notes (Signed)
Virtual Visit via Video   Due to the COVID-19 pandemic, this visit was completed with telemedicine (audio/video) technology to reduce patient and provider exposure as well as to preserve personal protective equipment.   I connected with Olivia Herrera by a video enabled telemedicine application and verified that I am speaking with the correct person using two identifiers. Location patient: Home Location provider: [facility], Office Persons participating in the virtual visit: Olivia Herrera, Briscoe Deutscher, DO   I discussed the limitations of evaluation and management by telemedicine and the availability of in person appointments. The patient expressed understanding and agreed to proceed.  Care Team   Patient Care Team: Briscoe Deutscher, DO as PCP - General (Family Medicine)  Subjective:   HPI: History of pilonidal cyst infection, with acute sacral pain and redness. Worried about another infection. Has been using heating pad and taking Motrin. Not in [place] right now - with grandmother in [place].   Review of Systems  Constitutional: Negative for chills, fever, malaise/fatigue and weight loss.  Respiratory: Negative for cough, shortness of breath and wheezing.   Cardiovascular: Negative for chest pain, palpitations and leg swelling.  Gastrointestinal: Negative for abdominal pain, constipation, diarrhea, nausea and vomiting.  Genitourinary: Negative for dysuria and urgency.  Musculoskeletal: Positive for back pain. Negative for joint pain and myalgias.  Skin: Negative for rash.       Erythema, ttp at sacral area and right of it.  Neurological: Negative for dizziness and headaches.  Psychiatric/Behavioral: Negative for depression, substance abuse and suicidal ideas. The patient is not nervous/anxious.     There are no active problems to display for this patient.   Social History   Tobacco Use  . Smoking status: Never Smoker  . Smokeless tobacco: Never Used  Substance Use Topics  .  Alcohol use: No   Current Outpatient Medications:  Ibuprofen 400 mg po TID prn pain.  No Known Allergies  Objective:   VITALS: Per patient if applicable, see vitals. GENERAL: Alert, appears well and in no acute distress. HEENT: Atraumatic, conjunctiva clear, no obvious abnormalities on inspection of external nose and ears. NECK: Normal movements of the head and neck. CARDIOPULMONARY: No increased WOB. Speaking in clear sentences. I:E ratio WNL.  MS: Moves all visible extremities without noticeable abnormality. PSYCH: Pleasant and cooperative, well-groomed. Speech normal rate and rhythm. Affect is appropriate. Insight and judgement are appropriate. Attention is focused, linear, and appropriate.  NEURO: CN grossly intact. Oriented as arrived to appointment on time with no prompting. Moves both UE equally.  SKIN: SEE MYCHART PHOTOS.  Depression screen [facility] 2/9 07/12/2019 01/05/2019 01/08/2018  Decreased Interest 3 0 0  Down, Depressed, Hopeless 0 1 0  PHQ - 2 Score 3 1 0  Altered sleeping 1 1 -  Tired, decreased energy 0 1 -  Change in appetite 1 1 -  Feeling bad or failure about yourself  0 1 -  Trouble concentrating 0 0 -  Moving slowly or fidgety/restless 0 1 -  Suicidal thoughts 0 0 -  PHQ-9 Score 5 6 -  Difficult doing work/chores Somewhat difficult Not difficult at all -    Assessment and Plan:   Olivia Herrera was seen today for cyst.  Diagnoses and all orders for this visit:  Pilonidal cyst Comments: Concern for new developing infection. Will go ahead and treat, See orders. Precuations reviewed.  Orders: -     sulfamethoxazole-trimethoprim (BACTRIM) 400-80 MG tablet; Take 1 tablet by mouth 2 (two) times daily. -  mupirocin ointment (BACTROBAN) 2 %; Place 1 application into the nose 2 (two) times daily.   Marland Kitchen COVID-19 Education: The signs and symptoms of COVID-19 were discussed with the patient and how to seek care for testing if needed. The importance of social distancing  was discussed today. . Reviewed expectations re: course of current medical issues. . Discussed self-management of symptoms. . Outlined signs and symptoms indicating need for more acute intervention. . Patient verbalized understanding and all questions were answered. Marland Kitchen Health Maintenance issues including appropriate healthy diet, exercise, and smoking avoidance were discussed with patient. . See orders for this visit as documented in the electronic medical record.  Briscoe Deutscher, DO  Records requested if needed. Time spent: 25 minutes, of which >50% was spent in obtaining information about her symptoms, reviewing her previous labs, evaluations, and treatments, counseling her about her condition (please see the discussed topics above), and developing a plan to further investigate it; she had a number of questions which I addressed.

## 2019-07-13 ENCOUNTER — Encounter: Payer: Self-pay | Admitting: Family Medicine

## 2019-07-15 ENCOUNTER — Telehealth: Payer: Self-pay | Admitting: Family Medicine

## 2019-07-15 NOTE — Telephone Encounter (Signed)
Please advise 

## 2019-07-15 NOTE — Telephone Encounter (Signed)
Pt received Rx for sulfamethoxazole-trimethoprim (BACTRIM) 400-80 MG tablet  And wants to know if she will need to take Fluconazole incase of a possible yeast infection from the medication. Pt has one pill from the last time or wants to know if Dr. Juleen China wants to send her a Rx for fluconazole to take after the BACTRIM / please advise

## 2019-07-15 NOTE — Telephone Encounter (Signed)
Called patient still has one diflucan left from last abx. She will take if having symptoms after meds. If does not get rid of them she will call for refill.   She states that we were going to send some back exercises did you just want lumbar exercises or was there something different that you wanted sent?   She wanted to know if there was a change she could make in her diet to prevent Pilonidal cyst?

## 2019-07-16 NOTE — Telephone Encounter (Signed)
I want to see if the antibiotic works before offering the exercises. If the pain is from a pilonidal cyst, then not MSK. Okay to Rx another diflucan if she needs it. No diet will keep this away. Let me know if changes.

## 2019-07-18 NOTE — Telephone Encounter (Signed)
Called patient gave all information she is having a lot of improvement. She rested and has taken the alive and doing a little better. She states abx is on back order so she has not been able to pick up.

## 2019-11-04 ENCOUNTER — Encounter: Payer: Self-pay | Admitting: Physician Assistant

## 2019-11-04 ENCOUNTER — Other Ambulatory Visit: Payer: Self-pay

## 2019-11-04 ENCOUNTER — Ambulatory Visit (INDEPENDENT_AMBULATORY_CARE_PROVIDER_SITE_OTHER): Payer: BC Managed Care – PPO | Admitting: Physician Assistant

## 2019-11-04 VITALS — Ht 62.5 in | Wt 110.0 lb

## 2019-11-04 DIAGNOSIS — F419 Anxiety disorder, unspecified: Secondary | ICD-10-CM | POA: Diagnosis not present

## 2019-11-04 DIAGNOSIS — F322 Major depressive disorder, single episode, severe without psychotic features: Secondary | ICD-10-CM

## 2019-11-04 NOTE — Progress Notes (Signed)
Virtual Visit via Video   I connected with Olivia Herrera on 11/04/19 at 11:40 AM EST by a video enabled telemedicine application and verified that I am speaking with the correct person using two identifiers. Location patient: Home Location provider: [facility], Office Persons participating in the virtual visit: HANADI GALANO, Inda Coke PA-C, Anselmo Pickler, LPN   I discussed the limitations of evaluation and management by telemedicine and the availability of in person appointments. The patient expressed understanding and agreed to proceed.  I acted as a Education administrator for [employer], PA-C [employer], LPN  Subjective:   HPI:   Anxiety and depression Patient with chief complaint of heartburn today. She states about 2 to 3 weeks ago she has noticed worsening chest soreness, heartburn, myalgias. She currently lives with her grandma and does not find her to be a helpful support system because she "worries too much about her". Patient reports that she misses her friends and doesn't confide in anyone. She states that it took a lot of courage for her to call and make an appointment today to discuss her symptoms. She reports that she has been having panic attacks and sleeping for less than 2 hours a night over the past several days. She states that she does have thoughts of hurting herself, but does have a barrier, specifically religion/God. She is tearful.  She took a Plan B about 2 weeks ago and states that some of her symptoms started after that. She states that she has taken Plan B before and has not had similar symptoms. She has not taken a urine pregnancy test, but does endorse breast soreness.  Chart review reveals that she was seen for depression in 2018 by prior provider. She states that she was recommended to go to counseling but she never did. She has never been on medications for her mood.  Depression screen [facility] 2/9 07/12/2019 01/05/2019 01/08/2018  Decreased Interest 3 0 0   Down, Depressed, Hopeless 0 1 0  PHQ - 2 Score 3 1 0  Altered sleeping 1 1 -  Tired, decreased energy 0 1 -  Change in appetite 1 1 -  Feeling bad or failure about yourself  0 1 -  Trouble concentrating 0 0 -  Moving slowly or fidgety/restless 0 1 -  Suicidal thoughts 0 0 -  PHQ-9 Score 5 6 -  Difficult doing work/chores Somewhat difficult Not difficult at all -   GAD 7 : Generalized Anxiety Score 11/04/2019  Nervous, Anxious, on Edge 3  Control/stop worrying 3  Worry too much - different things 3  Trouble relaxing 3  Restless 1  Easily annoyed or irritable 0  Afraid - awful might happen 3  Total GAD 7 Score 16  Anxiety Difficulty Very difficult      ROS: See pertinent positives and negatives per HPI.  There are no active problems to display for this patient.   Social History   Tobacco Use  . Smoking status: Never Smoker  . Smokeless tobacco: Never Used  Substance Use Topics  . Alcohol use: No    Current Outpatient Medications:  .  calcium carbonate (TUMS - DOSED IN MG ELEMENTAL CALCIUM) 500 MG chewable tablet, Chew 2 tablets by mouth daily., Disp: , Rfl:  .  cetirizine (ZYRTEC) 10 MG tablet, Take 1 tablet (10 mg total) by mouth daily., Disp: 90 tablet, Rfl: 0 .  cyclobenzaprine (FLEXERIL) 5 MG tablet, Take 1 tablet (5 mg total) by mouth 2 (two)  times daily as needed for muscle spasms., Disp: 15 tablet, Rfl: 0 .  naproxen (NAPROSYN) 375 MG tablet, Take 1 tablet (375 mg total) by mouth 2 (two) times daily., Disp: 20 tablet, Rfl: 0  No Known Allergies  Objective:   VITALS: Per patient if applicable, see vitals. GENERAL: Alert, appears well and in no acute distress. HEENT: Atraumatic, conjunctiva clear, no obvious abnormalities on inspection of external nose and ears. NECK: Normal movements of the head and neck. CARDIOPULMONARY: No increased WOB. Speaking in clear sentences. I:E ratio WNL.  MS: Moves all visible extremities without noticeable abnormality. PSYCH:  Tearful; cooperative, well-groomed. Speech normal rate and rhythm. Affect is appropriate. Insight and judgement are appropriate. Attention is focused, linear, and appropriate.  NEURO: CN grossly intact. Oriented as arrived to appointment on time with no prompting. Moves both UE equally.  SKIN: No obvious lesions, wounds, erythema, or cyanosis noted on face or hands.  Assessment and Plan:   Olivia Herrera was seen today for anxiety.  Diagnoses and all orders for this visit:  Anxiety  Depression, major, single episode, severe ([place])   Because patient has no support system currently and in the past was not compliant with recommendations for therapy, I do think that patient would best be served at the emergency room to have her mood further evaluated. I discussed with patient that she may need labs to further rule out other etiologies of her symptoms. I did speak with patient's boss, Martin Majestic, who patient verbally consented to me permission to talk to regarding her mood. Martin Majestic stated that she would make sure patient was taken to the hospital for further evaluation and treatment. Martin Majestic cell phone is [phone].) Patient verbalized understanding to plan and was in agreement.  . Reviewed expectations re: course of current medical issues. . Discussed self-management of symptoms. . Outlined signs and symptoms indicating need for more acute intervention. . Patient verbalized understanding and all questions were answered. Marland Kitchen Health Maintenance issues including appropriate healthy diet, exercise, and smoking avoidance were discussed with patient. . See orders for this visit as documented in the electronic medical record.  I discussed the assessment and treatment plan with the patient. The patient was provided an opportunity to ask questions and all were answered. The patient agreed with the plan and demonstrated an understanding of the instructions.   The patient was advised to call back or seek  an in-person evaluation if the symptoms worsen or if the condition fails to improve as anticipated.   CMA or LPN served as scribe during this visit. History, Physical, and Plan performed by medical provider. The above documentation has been reviewed and is accurate and complete.  I spent 25 minutes with this patient, greater than 50% was face-to-face time counseling regarding the above diagnoses.   [place], [place] 11/04/2019

## 2019-11-07 ENCOUNTER — Telehealth: Payer: Self-pay | Admitting: *Deleted

## 2019-11-07 NOTE — Telephone Encounter (Signed)
Spoke to pt told her calling to see how she is feeling since Aldona Bar talked to her last Friday. Pt said she is feeling much better, just the whole thing with COVID and college. Pt denies SI/HI. Told pt I would like her to follow up in one month for virtual visit but please call sooner if needed. Pt verbalized understanding and asked if I would call in a month to schedule. Told her I will make a note and get back to her then. Pt verbalized understanding.

## 2020-02-07 ENCOUNTER — Telehealth: Payer: Self-pay

## 2020-04-30 ENCOUNTER — Ambulatory Visit: Payer: Self-pay | Attending: Internal Medicine

## 2020-04-30 DIAGNOSIS — Z23 Encounter for immunization: Secondary | ICD-10-CM

## 2020-04-30 NOTE — Progress Notes (Signed)
   Covid-19 Vaccination Clinic  Name:  Olivia Herrera    MRN: [id number] DOB: 1996/06/29  04/30/2020  [name] was observed post Covid-19 immunization for 15 minutes without incident. She was provided with Vaccine Information Sheet and instruction to access the V-Safe system.   [name] was instructed to call 911 with any severe reactions post vaccine: Marland Kitchen Difficulty breathing  . Swelling of face and throat  . A fast heartbeat  . A bad rash all over body  . Dizziness and weakness   Immunizations Administered    Name Date Dose VIS Date Route   Pfizer COVID-19 Vaccine 04/30/2020  9:23 AM 0.3 mL 02/15/2019 Intramuscular   Manufacturer: [place]   Lot: [id number]   [place]: [id number]

## 2020-05-22 ENCOUNTER — Ambulatory Visit: Payer: Self-pay | Attending: Internal Medicine

## 2020-05-22 DIAGNOSIS — Z23 Encounter for immunization: Secondary | ICD-10-CM

## 2020-05-22 NOTE — Progress Notes (Signed)
   Covid-19 Vaccination Clinic  Name:  Olivia Herrera    MRN: [id number] DOB: [birth year]  05/22/2020  Olivia Herrera was observed post Covid-19 immunization for 15 minutes without incident. She was provided with Vaccine Information Sheet and instruction to access the V-Safe system.   Olivia Herrera was instructed to call 911 with any severe reactions post vaccine: Marland Kitchen Difficulty breathing  . Swelling of face and throat  . A fast heartbeat  . A bad rash all over body  . Dizziness and weakness   Immunizations Administered    Name Date Dose VIS Date Route   Pfizer COVID-19 Vaccine 05/22/2020  3:43 PM 0.3 mL 02/15/2019 Intramuscular   Manufacturer: [place]   Lot: [id number]   [place]: [id number]

## 2020-12-17 ENCOUNTER — Other Ambulatory Visit: Payer: Self-pay

## 2021-03-30 ENCOUNTER — Emergency Department (HOSPITAL_COMMUNITY): Payer: BC Managed Care – PPO

## 2021-03-30 ENCOUNTER — Emergency Department (HOSPITAL_COMMUNITY)
Admission: EM | Admit: 2021-03-30 | Discharge: 2021-03-30 | Disposition: A | Discharge status: Home / Self Care | Payer: BC Managed Care – PPO | Attending: Emergency Medicine | Admitting: Emergency Medicine

## 2021-03-30 ENCOUNTER — Other Ambulatory Visit: Payer: Self-pay

## 2021-03-30 ENCOUNTER — Encounter (HOSPITAL_COMMUNITY): Payer: Self-pay

## 2021-03-30 DIAGNOSIS — Y92410 Unspecified street and highway as the place of occurrence of the external cause: Secondary | ICD-10-CM | POA: Diagnosis not present

## 2021-03-30 DIAGNOSIS — M542 Cervicalgia: Secondary | ICD-10-CM | POA: Insufficient documentation

## 2021-03-30 DIAGNOSIS — M545 Low back pain, unspecified: Secondary | ICD-10-CM | POA: Insufficient documentation

## 2021-03-30 DIAGNOSIS — M25522 Pain in left elbow: Secondary | ICD-10-CM | POA: Diagnosis not present

## 2021-03-30 DIAGNOSIS — M7918 Myalgia, other site: Secondary | ICD-10-CM

## 2021-03-30 MED ORDER — METHOCARBAMOL 500 MG PO TABS: 1000.0000 mg | ORAL_TABLET | Freq: Four times a day (QID) | ORAL | 0 refills | Status: DC | PRN

## 2021-03-30 MED ORDER — NAPROXEN 500 MG PO TABS: 500.0000 mg | ORAL_TABLET | Freq: Two times a day (BID) | ORAL | 0 refills | Status: DC
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
## 2021-03-30 NOTE — ED Notes (Signed)
Patient transported to X-ray 

## 2021-09-18 ENCOUNTER — Ambulatory Visit (INDEPENDENT_AMBULATORY_CARE_PROVIDER_SITE_OTHER): Payer: BC Managed Care – PPO | Admitting: Physician Assistant

## 2021-09-18 ENCOUNTER — Other Ambulatory Visit: Payer: Self-pay

## 2021-09-18 ENCOUNTER — Encounter: Payer: Self-pay | Admitting: Physician Assistant

## 2021-09-18 VITALS — BP 115/77 | HR 87 | Temp 98.7°F | Ht 63.0 in | Wt 114.4 lb

## 2021-09-18 DIAGNOSIS — D229 Melanocytic nevi, unspecified: Secondary | ICD-10-CM

## 2021-09-18 DIAGNOSIS — D1722 Benign lipomatous neoplasm of skin and subcutaneous tissue of left arm: Secondary | ICD-10-CM | POA: Diagnosis not present
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
## 2021-09-18 NOTE — Patient Instructions (Signed)
I think this spot may be a small subcutaneous cyst or lipoma. No need to image or do any further work-up at this time. If it becomes bothersome, we can have general surgery or derm take a look.  Please change TOC appointment in November to a mole removal / biopsy with me (L shoulder).

## 2021-09-23 ENCOUNTER — Ambulatory Visit: Payer: BC Managed Care – PPO | Admitting: Physician Assistant

## 2021-09-25 ENCOUNTER — Ambulatory Visit: Payer: BC Managed Care – PPO | Admitting: Physician Assistant

## 2021-11-08 ENCOUNTER — Other Ambulatory Visit: Payer: Self-pay

## 2021-11-08 ENCOUNTER — Ambulatory Visit: Payer: BC Managed Care – PPO | Admitting: Physician Assistant

## 2021-11-08 ENCOUNTER — Encounter: Payer: Self-pay | Admitting: Physician Assistant

## 2021-11-08 VITALS — BP 123/83 | HR 75 | Temp 98.2°F | Ht 63.0 in | Wt 114.2 lb

## 2021-11-08 DIAGNOSIS — D2262 Melanocytic nevi of left upper limb, including shoulder: Secondary | ICD-10-CM | POA: Diagnosis not present

## 2021-11-08 DIAGNOSIS — D229 Melanocytic nevi, unspecified: Secondary | ICD-10-CM

## 2021-11-08 NOTE — Progress Notes (Signed)
   Subjective:    Patient ID: Olivia Herrera, female    DOB: 1996/08/01, 25 y.o.   MRN: [id number]  Chief Complaint  Patient presents with   Nevus    HPI Patient is in today for lesion removal as witnessed on last exam 09/18/21. No changes or symptoms per patient.   Past Medical History:  Diagnosis Date   Chlamydia infection 06/03/2017   History of chlamydia    Pilonidal cyst     No past surgical history on file.  Family History  Problem Relation Age of Onset   Alcohol abuse Father    Diabetes Paternal Grandfather    Diabetes Paternal Grandmother    Diabetes Maternal Grandmother     Social History   Tobacco Use   Smoking status: Never   Smokeless tobacco: Never  Substance Use Topics   Alcohol use: No   Drug use: No     No Known Allergies  Review of Systems NEGATIVE UNLESS OTHERWISE INDICATED IN HPI      Objective:     BP 123/83   Pulse 75   Temp 98.2 F (36.8 C)   Ht 5\' 3"  (1.6 m)   Wt 114 lb 3.2 oz (51.8 kg)   SpO2 100%   BMI 20.23 kg/m   Wt Readings from Last 3 Encounters:  11/08/21 114 lb 3.2 oz (51.8 kg)  09/18/21 114 lb 6.1 oz (51.9 kg)  03/30/21 120 lb (54.4 kg)    BP Readings from Last 3 Encounters:  11/08/21 123/83  09/18/21 115/77  03/30/21 122/76     Physical Exam Vitals and nursing note reviewed.  Constitutional:      Appearance: Normal appearance. She is normal weight.  Skin:    Comments: Left posterior shoulder there is a 2 cm subcutaneous, mobile, firm, nontender mass, consistent with probable cyst or lipoma.  Also noted on this left shoulder is a macular black irregular nevus about the size of a pencil eraser.  Neurological:     Mental Status: She is alert.       Assessment & Plan:   Problem List Items Addressed This Visit   None Visit Diagnoses     Atypical mole    -  Primary   Relevant Orders   Pathology ([facility])       1. Atypical mole Procedure explained and consent obtained. Left posterior shoulder  area cleaned in usual manner. Local anesthesia with 1.5 cc of Lidocaine 1% with Epi, using a 25 gauge needle. Dermablade used to remove the lesion. Hemostasis with Drysol. Band-Aid with Vaseline applied. Patient tolerated procedure well. Aftercare instructions provided. Lesion sent for pathology.     Jilliana Burkes M Brandan Glauber, PA-C

## 2021-11-18 LAB — ANATOMIC PATHOLOGY REPORT

## 2021-11-20 ENCOUNTER — Other Ambulatory Visit: Payer: Self-pay

## 2021-11-20 ENCOUNTER — Encounter: Payer: Self-pay | Admitting: Family

## 2021-11-20 ENCOUNTER — Ambulatory Visit: Payer: BC Managed Care – PPO | Admitting: Family

## 2021-11-20 VITALS — BP 111/76 | HR 93 | Temp 98.5°F | Ht 63.0 in | Wt 114.5 lb

## 2021-11-20 DIAGNOSIS — J028 Acute pharyngitis due to other specified organisms: Secondary | ICD-10-CM

## 2021-11-20 DIAGNOSIS — B9789 Other viral agents as the cause of diseases classified elsewhere: Secondary | ICD-10-CM | POA: Diagnosis not present

## 2021-11-20 DIAGNOSIS — R059 Cough, unspecified: Secondary | ICD-10-CM

## 2021-11-20 DIAGNOSIS — R6883 Chills (without fever): Secondary | ICD-10-CM

## 2021-11-20 LAB — POCT INFLUENZA A/B
Influenza A, POC: NEGATIVE
Influenza B, POC: NEGATIVE

## 2021-11-20 LAB — POCT RAPID STREP A (OFFICE): Rapid Strep A Screen: NEGATIVE

## 2021-11-20 NOTE — Patient Instructions (Signed)
It was very nice to see you today!  Your strep test and influenza tests are negative. You have a viral syndrome that may last for another few days. As discussed, take up to 600mg  of Advil or Ibuprofen (generic) 3 times per day to help with throat pain & inflammation. Take the Norel AD samples provided twice a day to help with your sinus symptoms.  Let us know if still not better next week!   PLEASE NOTE:  If you had any lab tests please let us know if you have not heard back within a few days. You may see your results on MyChart before we have a chance to review them but we will give you a call once they are reviewed by [place]. If we ordered any referrals today, please let us know if you have not heard from their office within the next week.   Please try these tips to maintain a healthy lifestyle:  Eat most of your calories during the day when you are active. Eliminate processed foods including packaged sweets (pies, cakes, cookies), reduce intake of potatoes, white bread, white pasta, and white rice. Look for whole grain options, oat flour or almond flour.  Each meal should contain half fruits/vegetables, one quarter protein, and one quarter carbs (no bigger than a computer mouse).  Cut down on sweet beverages. This includes juice, soda, and sweet tea. Also watch fruit intake, though this is a healthier sweet option, it still contains natural sugar! Limit to 3 servings daily.  Drink at least 1 glass of water with each meal and aim for at least 8 glasses per day  Exercise at least 150 minutes every week.

## 2021-11-20 NOTE — Assessment & Plan Note (Signed)
Flu test negative. Advised pt on  Taking Norel AD samples provided bid for next 3-4 days, use & SE reviewed. Take Ibuprofen 600mg  tid for aches/pain/fever. Increase water intake to at least 2 liters daily.

## 2021-11-20 NOTE — Progress Notes (Signed)
Subjective:     Patient ID: Olivia Herrera, female    DOB: 1996-06-12, 25 y.o.   MRN: [id number]  Chief Complaint  Patient presents with   Chills    Sx started 11/15/2021 Covid tested on 11/19/21-negative    Headache   Cough   Nasal Congestion   Ear Pain    Right ear pain that started 11/15/2021   Adenopathy    Hard to swallow on yesterday    HPI Upper Respiratory Infection: Symptoms include right ear pressure/pain, nasal congestion, non productive cough, sore throat, and swollen lymph nodes .  Onset of symptoms was 5 days ago, unchanged since that time. She is drinking moderate amounts of fluids. Evaluation to date: none.  Treatment to date:  Tylenol .    Health Maintenance Due  Topic Date Due   HIV Screening  Never done   Hepatitis C Screening  Never done   TETANUS/TDAP  11/21/2019   PAP-Cervical Cytology Screening  Never done   PAP SMEAR-Modifier  Never done   COVID-19 Vaccine (3 - Booster for Pfizer series) 07/17/2020   INFLUENZA VACCINE  07/22/2021    Past Medical History:  Diagnosis Date   Chlamydia infection 06/03/2017   History of chlamydia    Pilonidal cyst     History reviewed. No pertinent surgical history.  Outpatient Medications Prior to Visit  Medication Sig Dispense Refill   cetirizine (ZYRTEC) 10 MG tablet Take 1 tablet (10 mg total) by mouth daily. 90 tablet 0   calcium carbonate (TUMS - DOSED IN MG ELEMENTAL CALCIUM) 500 MG chewable tablet Chew 2 tablets by mouth daily.     methocarbamol (ROBAXIN) 500 MG tablet Take 2 tablets (1,000 mg total) by mouth every 6 (six) hours as needed for muscle spasms. 30 tablet 0   naproxen (NAPROSYN) 500 MG tablet Take 1 tablet (500 mg total) by mouth 2 (two) times daily. 30 tablet 0   No facility-administered medications prior to visit.    No Known Allergies      Objective:    Physical Exam Vitals and nursing note reviewed.  Constitutional:      Appearance: Normal appearance.  HENT:     Right  Ear: Tympanic membrane and ear canal normal.     Left Ear: Tympanic membrane and ear canal normal.     Nose:     Right Sinus: No frontal sinus tenderness.     Left Sinus: No frontal sinus tenderness.     Mouth/Throat:     Mouth: Mucous membranes are moist.     Pharynx: Posterior oropharyngeal erythema (mild) present. No pharyngeal swelling or oropharyngeal exudate.     Tonsils: No tonsillar exudate or tonsillar abscesses.  Cardiovascular:     Rate and Rhythm: Normal rate and regular rhythm.  Pulmonary:     Effort: Pulmonary effort is normal.     Breath sounds: Normal breath sounds.  Musculoskeletal:        General: Normal range of motion.  Lymphadenopathy:     Cervical: Cervical adenopathy present.     Right cervical: Superficial cervical adenopathy present.     Left cervical: Superficial cervical adenopathy present.  Skin:    General: Skin is warm and dry.  Neurological:     Mental Status: She is alert.  Psychiatric:        Mood and Affect: Mood normal.        Behavior: Behavior normal.    BP 111/76   Pulse 93   Temp 98.5  F (36.9 C) (Temporal)   Ht 5\' 3"  (1.6 m)   Wt 114 lb 8 oz (51.9 kg)   LMP 11/11/2021 (Exact Date)   SpO2 98%   BMI 20.28 kg/m  Wt Readings from Last 3 Encounters:  11/20/21 114 lb 8 oz (51.9 kg)  11/08/21 114 lb 3.2 oz (51.8 kg)  09/18/21 114 lb 6.1 oz (51.9 kg)       Assessment & Plan:   Problem List Items Addressed This Visit       Respiratory   Sore throat (viral)    Strep test negative. Advised to take 600mg  Advil (pt has at home) tid, warm salt water gargles, ok to use chloraseptic spray to numb throat if too painful to swallow.       Relevant Orders   POCT rapid strep A (Completed)     Other   Chills - Primary   Relevant Orders   POCT Influenza A/B (Completed)   POCT rapid strep A (Completed)   Cough    Flu test negative. Advised pt on  Taking Norel AD samples provided bid for next 3-4 days, use & SE reviewed. Take Ibuprofen  600mg  tid for aches/pain/fever. Increase water intake to at least 2 liters daily.        Relevant Orders   POCT Influenza A/B (Completed)    No orders of the defined types were placed in this encounter.

## 2021-11-20 NOTE — Assessment & Plan Note (Signed)
Strep test negative. Advised to take 600mg  Advil (pt has at home) tid, warm salt water gargles, ok to use chloraseptic spray to numb throat if too painful to swallow.

## 2021-11-21 ENCOUNTER — Ambulatory Visit: Payer: BC Managed Care – PPO | Admitting: Family

## 2022-02-12 ENCOUNTER — Other Ambulatory Visit (HOSPITAL_COMMUNITY)
Admission: RE | Admit: 2022-02-12 | Discharge: 2022-02-12 | Disposition: A | Discharge status: Home / Self Care | Payer: BC Managed Care – PPO | Source: Ambulatory Visit | Attending: Physician Assistant | Admitting: Physician Assistant

## 2022-02-12 ENCOUNTER — Other Ambulatory Visit: Payer: Self-pay

## 2022-02-12 ENCOUNTER — Ambulatory Visit: Payer: BC Managed Care – PPO | Admitting: Physician Assistant

## 2022-02-12 ENCOUNTER — Encounter: Payer: Self-pay | Admitting: Physician Assistant

## 2022-02-12 VITALS — BP 120/81 | HR 79 | Temp 98.0°F | Ht 63.0 in | Wt 116.2 lb

## 2022-02-12 DIAGNOSIS — R399 Unspecified symptoms and signs involving the genitourinary system: Secondary | ICD-10-CM | POA: Diagnosis present

## 2022-02-12 DIAGNOSIS — R300 Dysuria: Secondary | ICD-10-CM | POA: Diagnosis not present

## 2022-02-12 DIAGNOSIS — Z202 Contact with and (suspected) exposure to infections with a predominantly sexual mode of transmission: Secondary | ICD-10-CM | POA: Diagnosis not present

## 2022-02-12 DIAGNOSIS — Z1159 Encounter for screening for other viral diseases: Secondary | ICD-10-CM | POA: Diagnosis not present

## 2022-02-12 LAB — POC URINALSYSI DIPSTICK (AUTOMATED)
Bilirubin, UA: NEGATIVE
Blood, UA: NEGATIVE
Glucose, UA: NEGATIVE
Ketones, UA: NEGATIVE
Leukocytes, UA: NEGATIVE
Nitrite, UA: NEGATIVE
Protein, UA: POSITIVE — AB
Spec Grav, UA: >=1.03 — AB (ref 1.010–1.025)
Urobilinogen, UA: 0.2 E.U./dL
pH, UA: 6 (ref 5.0–8.0)

## 2022-02-12 NOTE — Progress Notes (Signed)
Subjective:    Patient ID: Olivia Herrera, female    DOB: [birth year], 26 y.o.   MRN: [id number]  Chief Complaint  Patient presents with   Exposure to STD    Exposure to STD   Patient is in today concerned about needing test for STDs.  She has a new partner as of 02/09/22. Female partner. Unsure of his STI history. Did not use condoms once with him. Yesterday felt some dysuria, but none today. No discharge of pelvic pain.  Pt has hx of BV and recurrent UTI. In high school she had one incident of chlamydia (at that time had burning with urination).   LMP 01/19/22, normal cycle.   Not on birth control. She was previously on it in high school and didn't feel well on it.   Past Medical History:  Diagnosis Date   Chlamydia infection 06/03/2017   History of chlamydia    Pilonidal cyst     History reviewed. No pertinent surgical history.  Family History  Problem Relation Age of Onset   Alcohol abuse Father    Diabetes Paternal Grandfather    Diabetes Paternal Grandmother    Diabetes Maternal Grandmother     Social History   Tobacco Use   Smoking status: Never   Smokeless tobacco: Never  Substance Use Topics   Alcohol use: No   Drug use: No     No Known Allergies  Review of Systems NEGATIVE UNLESS OTHERWISE INDICATED IN HPI      Objective:     BP 120/81    Pulse 79    Temp 98 F (36.7 C)    Ht 5\' 3"  (1.6 m)    Wt 116 lb 3.2 oz (52.7 kg)    LMP 01/19/2022    SpO2 97%    BMI 20.58 kg/m   Wt Readings from Last 3 Encounters:  02/12/22 116 lb 3.2 oz (52.7 kg)  11/20/21 114 lb 8 oz (51.9 kg)  11/08/21 114 lb 3.2 oz (51.8 kg)    BP Readings from Last 3 Encounters:  02/12/22 120/81  11/20/21 111/76  11/08/21 123/83     Physical Exam Vitals and nursing note reviewed.  Constitutional:      Appearance: Normal appearance. She is normal weight. She is not toxic-appearing.  HENT:     Head: Normocephalic and atraumatic.     Right Ear: External ear normal.      Left Ear: External ear normal.     Nose: Nose normal.     Mouth/Throat:     Mouth: Mucous membranes are moist.  Eyes:     Extraocular Movements: Extraocular movements intact.     Conjunctiva/sclera: Conjunctivae normal.     Pupils: Pupils are equal, round, and reactive to light.  Cardiovascular:     Rate and Rhythm: Normal rate and regular rhythm.     Pulses: Normal pulses.     Heart sounds: Normal heart sounds.  Pulmonary:     Effort: Pulmonary effort is normal.     Breath sounds: Normal breath sounds.  Abdominal:     General: Abdomen is flat.  Musculoskeletal:        General: Normal range of motion.     Cervical back: Normal range of motion and neck supple.  Skin:    General: Skin is warm and dry.  Neurological:     General: No focal deficit present.     Mental Status: She is alert and oriented to person, place, and time.  Psychiatric:  Mood and Affect: Mood is anxious.        Behavior: Behavior normal.        Thought Content: Thought content normal.        Judgment: Judgment normal.       Assessment & Plan:   Problem List Items Addressed This Visit   None Visit Diagnoses     Possible exposure to STD    -  Primary   Relevant Orders   Urine cytology ancillary only   HIV Antibody (routine testing w rflx)   RPR   Hepatitis C Antibody   Dysuria       Relevant Orders   POCT Urinalysis Dipstick (Automated) (Completed)   Urine Culture   Urine cytology ancillary only   Need for hepatitis C screening test       Relevant Orders   Hepatitis C Antibody       Plan: -Check urine and labs, treat pending results. -Discussed options for birth control, she declines at this time and is deciding on abstinence. If she does become sexually active again, need to remember safe sex guidelines and come back to talk about contraception.  F/up prn  Hilari Wethington M Madie Cahn, PA-C

## 2022-02-13 LAB — URINE CULTURE
MICRO NUMBER:: 13042456
SPECIMEN QUALITY:: ADEQUATE

## 2022-02-13 LAB — HEPATITIS C ANTIBODY
Hepatitis C Ab: NONREACTIVE
SIGNAL TO CUT-OFF: 0.12 (ref <1.00)

## 2022-02-13 LAB — URINE CYTOLOGY ANCILLARY ONLY
Chlamydia: NEGATIVE
Comment: NEGATIVE
Comment: NORMAL
Neisseria Gonorrhea: NEGATIVE

## 2022-02-13 LAB — RPR: RPR Ser Ql: NONREACTIVE

## 2022-02-13 LAB — HIV ANTIBODY (ROUTINE TESTING W REFLEX): HIV 1&2 Ab, 4th Generation: NONREACTIVE

## 2022-02-13 LAB — EXTRA LAV TOP TUBE

## 2022-05-08 ENCOUNTER — Encounter: Payer: Self-pay | Admitting: Physician Assistant

## 2022-05-08 ENCOUNTER — Other Ambulatory Visit (HOSPITAL_COMMUNITY)
Admission: RE | Admit: 2022-05-08 | Discharge: 2022-05-08 | Disposition: A | Discharge status: Home / Self Care | Payer: BC Managed Care – PPO | Source: Ambulatory Visit | Attending: Physician Assistant | Admitting: Physician Assistant

## 2022-05-08 ENCOUNTER — Ambulatory Visit: Payer: BC Managed Care – PPO | Admitting: Physician Assistant

## 2022-05-08 VITALS — BP 116/80 | HR 85 | Temp 98.0°F | Ht 63.0 in | Wt 114.6 lb

## 2022-05-08 DIAGNOSIS — F32A Depression, unspecified: Secondary | ICD-10-CM

## 2022-05-08 DIAGNOSIS — Z202 Contact with and (suspected) exposure to infections with a predominantly sexual mode of transmission: Secondary | ICD-10-CM

## 2022-05-08 DIAGNOSIS — F419 Anxiety disorder, unspecified: Secondary | ICD-10-CM | POA: Diagnosis not present

## 2022-05-08 DIAGNOSIS — N760 Acute vaginitis: Secondary | ICD-10-CM | POA: Insufficient documentation

## 2022-05-08 NOTE — Patient Instructions (Addendum)
Great to see you today!   [facility] [phone]  If you develop suicidal thoughts, please tell someone and immediately proceed to our local 24/7 crisis center, [facility] at the [facility]. [address] [place], [place]) [phone].  See handout for managing anxiety.  Will let you know about test results.

## 2022-05-08 NOTE — Progress Notes (Signed)
Subjective:    Patient ID: Olivia Herrera, female    DOB: 1996-08-07, 26 y.o.   MRN: [id number]  Chief Complaint  Patient presents with   Depression   Anxiety    Pt is requesting a letter for emotional support for her dog.    Exposure to STD    Pt is requesting screening for STI's. No symptoms.     HPI Patient is in today for the following:  Requesting letter for dog to be an emotional support animal: Apartment is expensive, not allowing animals Recently lost her job "Only stable thing going for her right now" Staying anxious, on-edge Unsure about family mental health hx   STD testing: Feels like her pH is thrown off, some discharge more days than other LMP 04/21/22 - normal cycle Same partner since visit in February  Using condoms Previously on the pill - felt bloated and emotional    Past Medical History:  Diagnosis Date   Chlamydia infection 06/03/2017   History of chlamydia    Pilonidal cyst     History reviewed. No pertinent surgical history.  Family History  Problem Relation Age of Onset   Alcohol abuse Father    Diabetes Paternal Grandfather    Diabetes Paternal Grandmother    Diabetes Maternal Grandmother     Social History   Tobacco Use   Smoking status: Never   Smokeless tobacco: Never  Substance Use Topics   Alcohol use: No   Drug use: No     No Known Allergies  Review of Systems NEGATIVE UNLESS OTHERWISE INDICATED IN HPI      Objective:     BP 116/80 (BP Location: Left Arm, Patient Position: Sitting, Cuff Size: Normal)   Pulse 85   Temp 98 F (36.7 C) (Temporal)   Ht '5\' 3"'$  (1.6 m)   Wt 114 lb 9.6 oz (52 kg)   SpO2 96%   BMI 20.30 kg/m   Wt Readings from Last 3 Encounters:  05/08/22 114 lb 9.6 oz (52 kg)  02/12/22 116 lb 3.2 oz (52.7 kg)  11/20/21 114 lb 8 oz (51.9 kg)    BP Readings from Last 3 Encounters:  05/08/22 116/80  02/12/22 120/81  11/20/21 111/76     Physical Exam Vitals and nursing note reviewed.   Constitutional:      Appearance: Normal appearance. She is normal weight. She is not toxic-appearing.  HENT:     Head: Normocephalic and atraumatic.     Right Ear: External ear normal.     Left Ear: External ear normal.     Nose: Nose normal.     Mouth/Throat:     Mouth: Mucous membranes are moist.  Eyes:     Extraocular Movements: Extraocular movements intact.     Conjunctiva/sclera: Conjunctivae normal.     Pupils: Pupils are equal, round, and reactive to light.  Cardiovascular:     Rate and Rhythm: Normal rate and regular rhythm.     Pulses: Normal pulses.     Heart sounds: Normal heart sounds.  Pulmonary:     Effort: Pulmonary effort is normal.     Breath sounds: Normal breath sounds.  Abdominal:     General: Abdomen is flat.  Musculoskeletal:        General: Normal range of motion.     Cervical back: Normal range of motion and neck supple.  Skin:    General: Skin is warm and dry.  Neurological:     General: No focal deficit  present.     Mental Status: She is alert and oriented to person, place, and time.  Psychiatric:        Mood and Affect: Mood is anxious.        Behavior: Behavior normal.        Thought Content: Thought content normal.        Judgment: Judgment normal.       Assessment & Plan:   Problem List Items Addressed This Visit       Other   Anxiety and depression - Primary   Relevant Orders   Ambulatory referral to Psychiatry   Possible exposure to STD   Relevant Orders   Cervicovaginal ancillary only( [place])   Cervicovaginal ancillary only (Completed)     No orders of the defined types were placed in this encounter.    Return in about 2 months (around 07/08/2022) for Well woman (pap) / recheck .  This note was prepared with assistance of Systems analyst. Occasional wrong-word or sound-a-like substitutions may have occurred due to the inherent limitations of voice recognition software.    Aviah Sorci M Evett Kassa,  PA-C

## 2022-05-09 ENCOUNTER — Ambulatory Visit: Payer: BC Managed Care – PPO | Admitting: Physician Assistant

## 2022-05-09 LAB — CERVICOVAGINAL ANCILLARY ONLY
Bacterial Vaginitis (gardnerella): POSITIVE — AB
Candida Glabrata: NEGATIVE
Candida Vaginitis: NEGATIVE
Chlamydia: NEGATIVE
Comment: NEGATIVE
Comment: NEGATIVE
Comment: NEGATIVE
Comment: NEGATIVE
Comment: NEGATIVE
Comment: NORMAL
Neisseria Gonorrhea: NEGATIVE
Trichomonas: NEGATIVE

## 2022-05-11 ENCOUNTER — Other Ambulatory Visit: Payer: Self-pay | Admitting: Physician Assistant

## 2022-05-11 MED ORDER — METRONIDAZOLE 500 MG PO TABS: 500.0000 mg | ORAL_TABLET | Freq: Two times a day (BID) | ORAL | 0 refills | Status: DC

## 2022-05-11 NOTE — Assessment & Plan Note (Signed)
-  Pt mostly concerned about changes in pH, G/C, ?BV or yeast -Pt performed self-swab, will treat pending results -Safe sex / abstinence advised.

## 2022-05-11 NOTE — Assessment & Plan Note (Signed)
[place] Visit from 05/08/2022 in [place]  PHQ-9 Total Score 10        05/08/2022    9:51 AM 11/04/2019   11:33 AM  GAD 7 : Generalized Anxiety Score  Nervous, Anxious, on Edge 3 3  Control/stop worrying 3 3  Worry too much - different things 3 3  Trouble relaxing 2 3  Restless 2 1  Easily annoyed or irritable 3 0  Afraid - awful might happen 2 3  Total GAD 7 Score 18 16  Anxiety Difficulty Extremely difficult Very difficult    - Denies any SI, but informed of 988 or behavioral urgent care -Referral to psychiatry at this time

## 2022-05-11 NOTE — Progress Notes (Signed)
Rx sent 

## 2022-07-04 ENCOUNTER — Ambulatory Visit (INDEPENDENT_AMBULATORY_CARE_PROVIDER_SITE_OTHER): Payer: BC Managed Care – PPO | Admitting: Physician Assistant

## 2022-07-04 ENCOUNTER — Encounter: Payer: Self-pay | Admitting: Physician Assistant

## 2022-07-04 ENCOUNTER — Other Ambulatory Visit (HOSPITAL_COMMUNITY)
Admission: RE | Admit: 2022-07-04 | Discharge: 2022-07-04 | Disposition: A | Discharge status: Home / Self Care | Payer: BC Managed Care – PPO | Source: Ambulatory Visit | Attending: Physician Assistant | Admitting: Physician Assistant

## 2022-07-04 VITALS — BP 112/74 | HR 70 | Temp 98.1°F | Ht 63.0 in | Wt 116.0 lb

## 2022-07-04 DIAGNOSIS — N760 Acute vaginitis: Secondary | ICD-10-CM | POA: Insufficient documentation

## 2022-07-04 DIAGNOSIS — Z202 Contact with and (suspected) exposure to infections with a predominantly sexual mode of transmission: Secondary | ICD-10-CM | POA: Insufficient documentation

## 2022-07-04 DIAGNOSIS — B9689 Other specified bacterial agents as the cause of diseases classified elsewhere: Secondary | ICD-10-CM

## 2022-07-04 NOTE — Progress Notes (Signed)
   Subjective:    Patient ID: Olivia Herrera, female    DOB: 1996/01/21, 26 y.o.   MRN: [id number]  Chief Complaint  Patient presents with   Follow-up    Pt states still having symptoms and would like to retest; pt having yellow tinge dsicharge, but denies itch and burning, didn't finish taking medicine correctly towards the end. Would like to test for STI; pt needs a referral to a specific OBGYN at [facility];     HPI Patient is in today for recheck BV. Diagnosed 05/08/22. Treated with metronidazole po, but said she forget a few pills and didn't finish entire course. Still having some yellowish vaginal discharge. No other symptoms. Also wanted retest G/C/T and requesting referral to GYN.   Past Medical History:  Diagnosis Date   Chlamydia infection 06/03/2017   History of chlamydia    Pilonidal cyst     History reviewed. No pertinent surgical history.  Family History  Problem Relation Age of Onset   Alcohol abuse Father    Diabetes Paternal Grandfather    Diabetes Paternal Grandmother    Diabetes Maternal Grandmother     Social History   Tobacco Use   Smoking status: Never   Smokeless tobacco: Never  Substance Use Topics   Alcohol use: No   Drug use: No     No Known Allergies  Review of Systems NEGATIVE UNLESS OTHERWISE INDICATED IN HPI      Objective:     BP 112/74 (BP Location: Right Arm)   Pulse 70   Temp 98.1 F (36.7 C) (Temporal)   Ht '5\' 3"'$  (1.6 m)   Wt 116 lb (52.6 kg)   LMP 06/15/2022 (Exact Date)   SpO2 97%   BMI 20.55 kg/m   Wt Readings from Last 3 Encounters:  07/04/22 116 lb (52.6 kg)  05/08/22 114 lb 9.6 oz (52 kg)  02/12/22 116 lb 3.2 oz (52.7 kg)    BP Readings from Last 3 Encounters:  07/04/22 112/74  05/08/22 116/80  02/12/22 120/81     Physical Exam Constitutional:      Appearance: Normal appearance.  Cardiovascular:     Rate and Rhythm: Normal rate and regular rhythm.  Pulmonary:     Effort: Pulmonary effort is  normal.     Breath sounds: Normal breath sounds.  Abdominal:     General: Abdomen is flat. Bowel sounds are normal.     Palpations: Abdomen is soft.     Tenderness: There is no right CVA tenderness or left CVA tenderness.  Neurological:     Mental Status: She is alert.  Psychiatric:        Mood and Affect: Mood normal.        Assessment & Plan:   Problem List Items Addressed This Visit       Other   Possible exposure to STD - Primary   Relevant Orders   Cervicovaginal ancillary only   Ambulatory referral to Gynecology   Other Visit Diagnoses     Bacterial vaginosis       Relevant Orders   Cervicovaginal ancillary only   Ambulatory referral to Gynecology      1. Possible exposure to STD 2. Bacterial vaginosis -Vaginal swab only today, suspect lingering BV -Treat pending results -Refer to GYN as requested  F/up prn   Carlyne Keehan M Shadd Dunstan, PA-C

## 2022-07-04 NOTE — Patient Instructions (Addendum)
Good to see you today!  Will call / treat pending results. Referral to GYN.   Have a good rest of summer.

## 2022-07-07 LAB — CERVICOVAGINAL ANCILLARY ONLY
Bacterial Vaginitis (gardnerella): NEGATIVE
Candida Glabrata: NEGATIVE
Candida Vaginitis: NEGATIVE
Chlamydia: NEGATIVE
Comment: NEGATIVE
Comment: NEGATIVE
Comment: NEGATIVE
Comment: NEGATIVE
Comment: NEGATIVE
Comment: NORMAL
Neisseria Gonorrhea: NEGATIVE
Trichomonas: NEGATIVE

## 2022-07-08 ENCOUNTER — Ambulatory Visit: Payer: BC Managed Care – PPO | Admitting: Physician Assistant

## 2022-09-15 ENCOUNTER — Encounter: Payer: Self-pay | Admitting: *Deleted

## 2022-11-20 ENCOUNTER — Ambulatory Visit: Payer: BC Managed Care – PPO | Admitting: Physician Assistant

## 2022-11-20 ENCOUNTER — Other Ambulatory Visit: Payer: Self-pay

## 2022-11-20 ENCOUNTER — Other Ambulatory Visit (HOSPITAL_COMMUNITY)
Admission: RE | Admit: 2022-11-20 | Discharge: 2022-11-20 | Disposition: A | Discharge status: Home / Self Care | Payer: BC Managed Care – PPO | Source: Ambulatory Visit | Attending: Physician Assistant | Admitting: Physician Assistant

## 2022-11-20 ENCOUNTER — Encounter: Payer: Self-pay | Admitting: Physician Assistant

## 2022-11-20 VITALS — BP 110/70 | HR 75 | Temp 97.8°F | Ht 63.0 in | Wt 120.0 lb

## 2022-11-20 DIAGNOSIS — R300 Dysuria: Secondary | ICD-10-CM

## 2022-11-20 LAB — POCT URINALYSIS DIPSTICK
Bilirubin, UA: NEGATIVE
Glucose, UA: NEGATIVE
Ketones, UA: NEGATIVE
Nitrite, UA: NEGATIVE
Protein, UA: POSITIVE — AB
Spec Grav, UA: 1.01 (ref 1.010–1.025)
Urobilinogen, UA: 0.2 E.U./dL
pH, UA: 7.5 (ref 5.0–8.0)

## 2022-11-20 MED ORDER — CEPHALEXIN 500 MG PO CAPS: 500.0000 mg | ORAL_CAPSULE | Freq: Two times a day (BID) | ORAL | 0 refills | Status: DC

## 2022-11-20 NOTE — Patient Instructions (Addendum)
Start keflex 500 mg twice daily x 1 week to treat for UTI  I will be in touch with all results!  General instructions Make sure you: Pee until your bladder is empty. Do not hold pee for a long time. Empty your bladder after sex. Wipe from front to back after pooping if you are a female. Use each tissue one time when you wipe. Drink enough fluid to keep your pee pale yellow. Keep all follow-up visits as told by your doctor. This is important. Contact a doctor if: You do not get better after 1-2 days. Your symptoms go away and then come back. Get help right away if: You have very bad back pain. You have very bad pain in your lower belly. You have a fever. You are sick to your stomach (nauseous). You are throwing up.

## 2022-11-20 NOTE — Progress Notes (Signed)
Olivia Herrera is a 26 y.o. female here for a new problem.  History of Present Illness:   Chief Complaint  Patient presents with   Dysuria    Pt c/o discomfort with urination x 2 days, Denies back pain and no fever or chills.   Dysuria Patient is complaining of irritation with urination occurring 2 days ago and suspects it may be an UTI. She reports that she has had an UTI before and this is how they present.  She denies vaginal discharge, chance of pregnancy, abdominal pain, fever, back pain, and chills.   Past Medical History:  Diagnosis Date   Chlamydia infection 06/03/2017   History of chlamydia    Pilonidal cyst      Social History   Tobacco Use   Smoking status: Never   Smokeless tobacco: Never  Substance Use Topics   Alcohol use: No   Drug use: No    History reviewed. No pertinent surgical history.  Family History  Problem Relation Age of Onset   Alcohol abuse Father    Diabetes Paternal Grandfather    Diabetes Paternal Grandmother    Diabetes Maternal Grandmother     No Known Allergies  Current Medications:   Current Outpatient Medications:    cetirizine (ZYRTEC) 10 MG tablet, Take 1 tablet (10 mg total) by mouth daily., Disp: 90 tablet, Rfl: 0   Review of Systems:   Review of Systems  Constitutional:  Negative for chills and fever.  Gastrointestinal:  Negative for abdominal pain.  Genitourinary:  Positive for dysuria.  Musculoskeletal:  Negative for back pain.    Vitals:   Vitals:   11/20/22 1135  BP: 110/70  Pulse: 75  Temp: 97.8 F (36.6 C)  TempSrc: Temporal  SpO2: 98%  Weight: 120 lb (54.4 kg)  Height: '5\' 3"'$  (1.6 m)     Body mass index is 21.26 kg/m.  Physical Exam:   Physical Exam Constitutional:      General: She is not in acute distress.    Appearance: Normal appearance. She is not ill-appearing.  HENT:     Head: Normocephalic and atraumatic.     Right Ear: External ear normal.     Left Ear: External ear normal.  Eyes:      Extraocular Movements: Extraocular movements intact.     Pupils: Pupils are equal, round, and reactive to light.  Cardiovascular:     Rate and Rhythm: Normal rate and regular rhythm.     Heart sounds: Normal heart sounds. No murmur heard.    No gallop.  Pulmonary:     Effort: Pulmonary effort is normal. No respiratory distress.     Breath sounds: Normal breath sounds. No wheezing or rales.  Abdominal:     Tenderness: There is no right CVA tenderness or left CVA tenderness.  Skin:    General: Skin is warm and dry.  Neurological:     Mental Status: She is alert and oriented to person, place, and time.  Psychiatric:        Judgment: Judgment normal.    Results for orders placed or performed in visit on 11/20/22  POCT Urinalysis Dipstick  Result Value Ref Range   Color, UA yellow    Clarity, UA cloudy    Glucose, UA Negative Negative   Bilirubin, UA negative    Ketones, UA negative    Spec Grav, UA 1.010 1.010 - 1.025   Blood, UA 3+    pH, UA 7.5 5.0 - 8.0  Protein, UA Positive (A) Negative   Urobilinogen, UA 0.2 0.2 or 1.0 E.U./dL   Nitrite, UA negative    Leukocytes, UA Large (3+) (A) Negative   Appearance     Odor       Assessment and Plan:   Dysuria UA concerning for UTI No red flags Start 500 mg keflex BID x 7 days Urine culture and vaginal self-swab completed Will add additional treatment based on results Avoid sexual activity until results have returned  I,Verona Buck,acting as a scribe for [employer], PA.,have documented all relevant documentation on the behalf of Inda Coke, PA,as directed by  Inda Coke, PA while in the presence of Inda Coke, [place].  I, Inda Coke, [place], have reviewed all documentation for this visit. The documentation on 11/20/22 for the exam, diagnosis, procedures, and orders are all accurate and complete.  Inda Coke, PA-C

## 2022-11-21 LAB — CERVICOVAGINAL ANCILLARY ONLY
Bacterial Vaginitis (gardnerella): POSITIVE — AB
Candida Glabrata: NEGATIVE
Candida Vaginitis: POSITIVE — AB
Chlamydia: NEGATIVE
Comment: NEGATIVE
Comment: NEGATIVE
Comment: NEGATIVE
Comment: NEGATIVE
Comment: NEGATIVE
Comment: NORMAL
Neisseria Gonorrhea: NEGATIVE
Trichomonas: NEGATIVE

## 2022-11-22 ENCOUNTER — Encounter: Payer: Self-pay | Admitting: Physician Assistant

## 2022-11-23 ENCOUNTER — Other Ambulatory Visit: Payer: Self-pay | Admitting: Physician Assistant

## 2022-11-23 ENCOUNTER — Encounter: Payer: Self-pay | Admitting: Physician Assistant

## 2022-11-23 LAB — URINE CULTURE
MICRO NUMBER:: 14252296
SPECIMEN QUALITY:: ADEQUATE

## 2022-11-23 MED ORDER — FLUCONAZOLE 150 MG PO TABS: 150.0000 mg | ORAL_TABLET | Freq: Once | ORAL | 0 refills | Status: AC

## 2022-11-23 MED ORDER — METRONIDAZOLE 500 MG PO TABS: 500.0000 mg | ORAL_TABLET | Freq: Two times a day (BID) | ORAL | 0 refills | Status: AC

## 2022-11-24 MED ORDER — FLUCONAZOLE 150 MG PO TABS: 150.0000 mg | ORAL_TABLET | Freq: Once | ORAL | 0 refills | Status: AC

## 2022-11-25 ENCOUNTER — Telehealth: Payer: Self-pay | Admitting: Physician Assistant

## 2022-11-25 MED ORDER — CEPHALEXIN 500 MG PO CAPS: 500.0000 mg | ORAL_CAPSULE | Freq: Two times a day (BID) | ORAL | 0 refills | Status: DC

## 2022-11-25 NOTE — Telephone Encounter (Signed)
Rx sent to pharmacy for Keflex 500 mg BID for the additional 2 days.

## 2022-12-30 ENCOUNTER — Ambulatory Visit: Payer: BC Managed Care – PPO | Admitting: Physician Assistant

## 2023-01-08 ENCOUNTER — Ambulatory Visit: Payer: BC Managed Care – PPO | Admitting: Physician Assistant

## 2023-01-08 ENCOUNTER — Encounter: Payer: Self-pay | Admitting: Physician Assistant

## 2023-01-08 ENCOUNTER — Other Ambulatory Visit (HOSPITAL_COMMUNITY)
Admission: RE | Admit: 2023-01-08 | Discharge: 2023-01-08 | Disposition: A | Discharge status: Home / Self Care | Payer: BC Managed Care – PPO | Source: Ambulatory Visit | Attending: Physician Assistant | Admitting: Physician Assistant

## 2023-01-08 VITALS — BP 120/73 | HR 88 | Temp 97.3°F | Ht 63.0 in | Wt 125.2 lb

## 2023-01-08 DIAGNOSIS — N898 Other specified noninflammatory disorders of vagina: Secondary | ICD-10-CM

## 2023-01-08 DIAGNOSIS — R300 Dysuria: Secondary | ICD-10-CM

## 2023-01-08 LAB — POCT URINALYSIS DIPSTICK OB
Bilirubin, UA: NEGATIVE
Blood, UA: 10
Glucose, UA: NEGATIVE
Ketones, UA: NEGATIVE
Nitrite, UA: NEGATIVE
POC,PROTEIN,UA: NEGATIVE
Spec Grav, UA: 1.01 (ref 1.010–1.025)
Urobilinogen, UA: 0.2 E.U./dL
pH, UA: 6 (ref 5.0–8.0)

## 2023-01-08 MED ORDER — FLUCONAZOLE 150 MG PO TABS: 150.0000 mg | ORAL_TABLET | Freq: Every day | ORAL | 0 refills | Status: DC

## 2023-01-08 NOTE — Progress Notes (Signed)
Subjective:    Patient ID: Olivia Herrera, female    DOB: 19-Feb-1996, 27 y.o.   MRN: [id number]  Chief Complaint  Patient presents with   Urinary Tract Infection    Pt had sx before period, and now linger after taking meds a week ago   Vaginitis   Follow-up    Urinary Tract Infection    Patient is in today for urinary / vaginal concerns.  She is having itching, vaginal discharge yellow / tinted. No abdominal cramping or back pain. No pelvic pain. No recent intercourse, no change since last visit. Some dysuria. No flank pain. No other symptoms to report. No concern for STIs.   LMP 12/29/22 - 01/05/23.    Past Medical History:  Diagnosis Date   Chlamydia infection 06/03/2017   History of chlamydia    Pilonidal cyst     History reviewed. No pertinent surgical history.  Family History  Problem Relation Age of Onset   Alcohol abuse Father    Diabetes Paternal Grandfather    Diabetes Paternal Grandmother    Diabetes Maternal Grandmother     Social History   Tobacco Use   Smoking status: Never   Smokeless tobacco: Never  Substance Use Topics   Alcohol use: No   Drug use: No     No Known Allergies  Review of Systems NEGATIVE UNLESS OTHERWISE INDICATED IN HPI      Objective:     BP 120/73 (BP Location: Left Arm, Patient Position: Sitting)   Pulse 88   Temp (!) 97.3 F (36.3 C) (Temporal)   Ht '5\' 3"'$  (1.6 m)   Wt 125 lb 3.2 oz (56.8 kg)   LMP 12/29/2022 (Exact Date)   SpO2 100%   BMI 22.18 kg/m   Wt Readings from Last 3 Encounters:  01/08/23 125 lb 3.2 oz (56.8 kg)  11/20/22 120 lb (54.4 kg)  07/04/22 116 lb (52.6 kg)    BP Readings from Last 3 Encounters:  01/08/23 120/73  11/20/22 110/70  07/04/22 112/74     Physical Exam Vitals and nursing note reviewed.  Constitutional:      Appearance: Normal appearance.  Cardiovascular:     Rate and Rhythm: Normal rate and regular rhythm.     Heart sounds: No murmur heard. Pulmonary:     Effort:  Pulmonary effort is normal.     Breath sounds: Normal breath sounds.  Genitourinary:    Comments: GU exam declined, pt performed self-swab Neurological:     Mental Status: She is alert.  Psychiatric:        Mood and Affect: Mood normal.        Behavior: Behavior normal.        Assessment & Plan:  Dysuria -     POC Urinalysis Dipstick OB -     Cervicovaginal ancillary only -     Urine Culture  Vaginal discharge -     Cervicovaginal ancillary only  Other orders -     Fluconazole; Take 1 tablet (150 mg total) by mouth daily. Repeat dose 72 hours after first dose if still symptomatic.  Dispense: 2 tablet; Refill: 0    Plan to treat for possible yeast this weekend with Diflucan 150 mg tablet. Hope to have results of UC and Aptima swab by early next week, treat pending those results. Recommend probiotic / yogurt daily to help prevent recurrence. Drink plenty of water.     Return if symptoms worsen or fail to improve.  Makaylah Oddo M Filiberto Wamble, PA-C

## 2023-01-09 LAB — CERVICOVAGINAL ANCILLARY ONLY
Bacterial Vaginitis (gardnerella): NEGATIVE
Candida Glabrata: NEGATIVE
Candida Vaginitis: POSITIVE — AB
Comment: NEGATIVE
Comment: NEGATIVE
Comment: NEGATIVE

## 2023-01-09 LAB — URINE CULTURE
MICRO NUMBER:: 14443802
Result:: NO GROWTH
SPECIMEN QUALITY:: ADEQUATE

## 2023-03-17 NOTE — Progress Notes (Shared)
NEFELI BIRT is a 27 y.o. female here for a new problem.  History of Present Illness:   No chief complaint on file.   HPI  Requesting STI blood testing.    Past Medical History:  Diagnosis Date   Chlamydia infection 06/03/2017   History of chlamydia    Pilonidal cyst      Social History   Tobacco Use   Smoking status: Never   Smokeless tobacco: Never  Substance Use Topics   Alcohol use: No   Drug use: No    No past surgical history on file.  Family History  Problem Relation Age of Onset   Alcohol abuse Father    Diabetes Paternal Grandfather    Diabetes Paternal Grandmother    Diabetes Maternal Grandmother     No Known Allergies  Current Medications:   Current Outpatient Medications:    cetirizine (ZYRTEC) 10 MG tablet, Take 1 tablet (10 mg total) by mouth daily., Disp: 90 tablet, Rfl: 0   fluconazole (DIFLUCAN) 150 MG tablet, Take 1 tablet (150 mg total) by mouth daily. Repeat dose 72 hours after first dose if still symptomatic., Disp: 2 tablet, Rfl: 0   Review of Systems:   ROS  Vitals:   There were no vitals filed for this visit.   There is no height or weight on file to calculate BMI.  Physical Exam:   Physical Exam  Assessment and Plan:   ***   I,Alexander Ruley,acting as a scribe for Inda Coke, PA.,have documented all relevant documentation on the behalf of Inda Coke, PA,as directed by  Inda Coke, PA while in the presence of Inda Coke, [place].   ***   Inda Coke, PA-C

## 2023-03-18 ENCOUNTER — Ambulatory Visit: Payer: BC Managed Care – PPO | Admitting: Physician Assistant

## 2023-03-18 ENCOUNTER — Other Ambulatory Visit (HOSPITAL_COMMUNITY)
Admission: RE | Admit: 2023-03-18 | Discharge: 2023-03-18 | Disposition: A | Discharge status: Home / Self Care | Payer: BC Managed Care – PPO | Source: Ambulatory Visit | Attending: Physician Assistant | Admitting: Physician Assistant

## 2023-03-18 ENCOUNTER — Encounter: Payer: Self-pay | Admitting: Physician Assistant

## 2023-03-18 VITALS — BP 110/70 | HR 80 | Temp 97.5°F | Ht 63.0 in | Wt 121.4 lb

## 2023-03-18 DIAGNOSIS — N760 Acute vaginitis: Secondary | ICD-10-CM | POA: Insufficient documentation

## 2023-03-18 MED ORDER — FLUCONAZOLE 150 MG PO TABS: ORAL_TABLET | ORAL | 0 refills | Status: DC

## 2023-03-18 NOTE — Patient Instructions (Addendum)
Start diflucan tablet for possible yeast  Look at [employer] products  Vaginal Hygiene Healthy practices for vaginal hygiene - Avoid pajamas. A robe / nightgown allows better air circulation. Sleep without panties / panties whenever possible. - Wear cotton panties / panties during the day. After washing the panties / panties, rinse them twice to avoid irritating residues. Do not use fabric softeners for panties and / or swimsuits. - Avoid tights, leotard, leggings, tight pants or other tight attire. Loose skirts and pants allow air to circulate. - Avoid the pantiprotector. Better use tampons or sanitary pads. It is beneficial to bathe with warm water every day: - Soak in clean water (without soap) for 10 to 15 minutes. It has not been specifically studied that adding vinegar or baking soda / baking soda to water is of benefit and may not be better than water alone. - Use soap to wash the other regions apart from the genital area before leaving the tub. Limit the use of soap in the genital areas. Use soaps without fragrance. - Rinse the genital area and pat dry. A hair dryer can only be used if cold air is used. - Do not use bubble bath or fragrant soap. - Do not use vaginal sprays or talcum powder. These contain chemicals that irritate the skin. - If the genital area is sore or swollen, fragrance-free disposable wipes can be used instead of toilet paper. - Emollients, such as Vaseline may help protect the skin and may be applied to the irritated area. - Always remember to clean yourself from front to back after bowel movements. Pat dry after urinating. - Don't keep the wet swimsuit on for a long time after swimming.

## 2023-03-18 NOTE — Progress Notes (Signed)
Olivia Herrera is a 27 y.o. female here for a follow up of a pre-existing problem.  History of Present Illness:   Chief Complaint  Patient presents with   STD testing    Pt would like STD testing done, c/o green vaginal discharge started two days ago, also having vaginal itching.    HPI  Vaginitis Was recently tested for yeast and diflucan was sent in in Jan She took one tablet but did not feel like symptoms fully resolved  Has had reduced sexual activity overall but has had one partner and one encounter since last here in Jan Under a lot of stress Starting working out a lot more Has new detergent (recently moved in with mom)  Not drinking a ton of water  Vaginal discomfort when itching No changes to BM, current urinary symptoms, lower pelvic pain  She has tried OTC AZO Probiotic health tablets   Past Medical History:  Diagnosis Date   Chlamydia infection 06/03/2017   History of chlamydia    Pilonidal cyst      Social History   Tobacco Use   Smoking status: Never   Smokeless tobacco: Never  Substance Use Topics   Alcohol use: No   Drug use: No    History reviewed. No pertinent surgical history.  Family History  Problem Relation Age of Onset   Alcohol abuse Father    Diabetes Paternal Grandfather    Diabetes Paternal Grandmother    Diabetes Maternal Grandmother     No Known Allergies  Current Medications:   Current Outpatient Medications:    cetirizine (ZYRTEC) 10 MG tablet, Take 1 tablet (10 mg total) by mouth daily., Disp: 90 tablet, Rfl: 0   Review of Systems:   ROS Negative unless otherwise specified per HPI.  Vitals:   Vitals:   03/18/23 0826  BP: 110/70  Pulse: 80  Temp: (!) 97.5 F (36.4 C)  TempSrc: Temporal  Weight: 121 lb 6.1 oz (55.1 kg)  Height: 5\' 3"  (1.6 m)     Body mass index is 21.5 kg/m.  Physical Exam:   Physical Exam Constitutional:      Appearance: Normal appearance. She is well-developed.  HENT:     Head:  Normocephalic and atraumatic.  Eyes:     General: Lids are normal.     Extraocular Movements: Extraocular movements intact.     Conjunctiva/sclera: Conjunctivae normal.  Pulmonary:     Effort: Pulmonary effort is normal.  Musculoskeletal:        General: Normal range of motion.     Cervical back: Normal range of motion and neck supple.  Skin:    General: Skin is warm and dry.  Neurological:     Mental Status: She is alert and oriented to person, place, and time.  Psychiatric:        Attention and Perception: Attention and perception normal.        Mood and Affect: Mood normal.        Behavior: Behavior normal.        Thought Content: Thought content normal.        Judgment: Judgment normal.    Patient obtained self swab  Assessment and Plan:   Acute vaginitis No red flags Will empirically for yeast with diflucan -- discussed that she may take another in 3-5 days if needed Vaginal swab obtained and will add on additional treatment based on results Follow-up with PCP if sx persist/worsen  Inda Coke, PA-C

## 2023-03-19 ENCOUNTER — Other Ambulatory Visit: Payer: Self-pay | Admitting: Physician Assistant

## 2023-03-19 LAB — CERVICOVAGINAL ANCILLARY ONLY
Bacterial Vaginitis (gardnerella): POSITIVE — AB
Candida Glabrata: NEGATIVE
Candida Vaginitis: POSITIVE — AB
Chlamydia: NEGATIVE
Comment: NEGATIVE
Comment: NEGATIVE
Comment: NEGATIVE
Comment: NEGATIVE
Comment: NEGATIVE
Comment: NORMAL
Neisseria Gonorrhea: NEGATIVE
Trichomonas: NEGATIVE

## 2023-03-19 MED ORDER — FLUCONAZOLE 150 MG PO TABS: ORAL_TABLET | ORAL | 0 refills | Status: DC

## 2023-03-19 MED ORDER — METRONIDAZOLE 500 MG PO TABS: 500.0000 mg | ORAL_TABLET | Freq: Two times a day (BID) | ORAL | 0 refills | Status: AC

## 2023-10-15 ENCOUNTER — Ambulatory Visit: Payer: BC Managed Care – PPO | Admitting: Family

## 2023-10-15 ENCOUNTER — Encounter: Payer: Self-pay | Admitting: Family

## 2023-10-15 VITALS — BP 102/66 | HR 101 | Temp 98.6°F | Ht 63.0 in | Wt 120.8 lb

## 2023-10-15 DIAGNOSIS — U071 COVID-19: Secondary | ICD-10-CM | POA: Diagnosis not present

## 2023-10-15 LAB — POCT INFLUENZA A/B
Influenza A, POC: NEGATIVE
Influenza B, POC: NEGATIVE

## 2023-10-15 LAB — POC COVID19 BINAXNOW: SARS Coronavirus 2 Ag: POSITIVE — AB

## 2023-10-15 MED ORDER — NIRMATRELVIR/RITONAVIR (PAXLOVID)TABLET: 3.0000 | ORAL_TABLET | Freq: Two times a day (BID) | ORAL | 0 refills | Status: AC

## 2023-10-15 NOTE — Progress Notes (Signed)
Patient ID: Olivia Herrera, female    DOB: 1996/05/28, 27 y.o.   MRN: [id number]  Chief Complaint  Patient presents with   Nasal Congestion    Pt c/o of nasal congestion for 4 days. Pt has tried DayQuil that helped some.    Headache    Pt c/o of sinus migraine for 4 days    Sinusitis   *Discussed the use of AI scribe software for clinical note transcription with the patient, who gave verbal consent to proceed.  History of Present Illness   Olivia Herrera, a patient with a history of COVID-19 in 2020, presents with symptoms of COVID-19 that started four days ago. She tested positive for COVID-19 today. She has been experiencing chills and fever. She has been taking ibuprofen at home for symptom relief. She has been taking Doxil for an unspecified condition. She is currently working and is concerned about missing work due to her illness.      Assessment & Plan:     COVID-19 - Positive test with symptoms starting 4 days ago. Discussed the benefits of antiviral treatment with Paxlovid, including symptom relief and decreased contagiousness. Reviewed potential side effects including nausea, vomiting, diarrhea, altered taste/smell, and aftertaste. -Start Paxlovid twice daily for 5 days, advised on use & SE. -Check cost with insurance and notify if too expensive. -Take ibuprofen 600mg  (3 pills) three times a day as needed for the next few days. -Discussed the need for masking if around others. -Provide work note indicating return to work on Monday.      Subjective:    Outpatient Medications Prior to Visit  Medication Sig Dispense Refill   cetirizine (ZYRTEC) 10 MG tablet Take 1 tablet (10 mg total) by mouth daily. (Patient not taking: Reported on 10/15/2023) 90 tablet 0   fluconazole (DIFLUCAN) 150 MG tablet Take one today and then repeat in 3-5 days if needed. (Patient not taking: Reported on 10/15/2023) 2 tablet 0   No facility-administered medications prior to visit.   Past Medical  History:  Diagnosis Date   Chlamydia infection 06/03/2017   History of chlamydia    Pilonidal cyst    History reviewed. No pertinent surgical history. No Known Allergies    Objective:    Physical Exam Vitals and nursing note reviewed.  Constitutional:      Appearance: Normal appearance. She is ill-appearing.     Interventions: Face mask in place.  HENT:     Right Ear: Tympanic membrane and ear canal normal.     Left Ear: Tympanic membrane and ear canal normal.     Nose:     Right Sinus: Frontal sinus tenderness present.     Left Sinus: Frontal sinus tenderness present.     Mouth/Throat:     Mouth: Mucous membranes are moist.     Pharynx: Posterior oropharyngeal erythema present. No pharyngeal swelling, oropharyngeal exudate or uvula swelling.     Tonsils: No tonsillar exudate or tonsillar abscesses.  Cardiovascular:     Rate and Rhythm: Normal rate and regular rhythm.  Pulmonary:     Effort: Pulmonary effort is normal.     Breath sounds: Normal breath sounds.  Musculoskeletal:        General: Normal range of motion.  Lymphadenopathy:     Head:     Right side of head: No preauricular or posterior auricular adenopathy.     Left side of head: No preauricular or posterior auricular adenopathy.     Cervical: No cervical adenopathy.  Skin:  General: Skin is warm and dry.  Neurological:     Mental Status: She is alert.  Psychiatric:        Mood and Affect: Mood normal.        Behavior: Behavior normal.    BP 102/66   Pulse (!) 101   Temp 98.6 F (37 C)   Ht 5\' 3"  (1.6 m)   Wt 120 lb 12.8 oz (54.8 kg)   SpO2 97%   BMI 21.40 kg/m  Wt Readings from Last 3 Encounters:  10/15/23 120 lb 12.8 oz (54.8 kg)  03/18/23 121 lb 6.1 oz (55.1 kg)  01/08/23 125 lb 3.2 oz (56.8 kg)       Dulce Sellar, NP

## 2023-12-15 ENCOUNTER — Other Ambulatory Visit (HOSPITAL_COMMUNITY)
Admission: RE | Admit: 2023-12-15 | Discharge: 2023-12-15 | Disposition: A | Discharge status: Home / Self Care | Payer: BC Managed Care – PPO | Source: Ambulatory Visit | Attending: Physician Assistant | Admitting: Physician Assistant

## 2023-12-15 ENCOUNTER — Ambulatory Visit: Payer: BC Managed Care – PPO | Admitting: Physician Assistant

## 2023-12-15 VITALS — BP 124/76 | HR 84 | Temp 97.1°F | Ht 63.0 in | Wt 123.4 lb

## 2023-12-15 DIAGNOSIS — N760 Acute vaginitis: Secondary | ICD-10-CM

## 2023-12-15 DIAGNOSIS — Z833 Family history of diabetes mellitus: Secondary | ICD-10-CM

## 2023-12-15 LAB — POCT GLYCOSYLATED HEMOGLOBIN (HGB A1C): Hemoglobin A1C: 5 % (ref 4.0–5.6)

## 2023-12-15 MED ORDER — FLUCONAZOLE 150 MG PO TABS: ORAL_TABLET | ORAL | 0 refills | Status: DC

## 2023-12-15 NOTE — Progress Notes (Signed)
Patient ID: Olivia Herrera, female    DOB: 01/30/1996, 27 y.o.   MRN: [id number]   Assessment & Plan:  Acute vaginitis -     Cervicovaginal ancillary only  Family history of diabetes mellitus -     POCT glycosylated hemoglobin (Hb A1C)  Other orders -     Fluconazole; Take one today and then repeat in 3-5 days if needed.  Dispense: 2 tablet; Refill: 0   Assessment and Plan    Vaginal Discomfort and Itching Symptoms suggestive of recurrent vaginal infection, possibly bacterial vaginosis or yeast infection. History of both in the past. Recent stressors, illness, and changes in diet may have contributed to recurrence. -Perform vaginal swab to identify the cause of symptoms. -Prescribe Diflucan today to treat possible yeast infection. -Depending on swab results, may need to prescribe additional treatment.  Hyperglycemia Patient reports changes in diet and symptoms that may be associated with high blood sugar levels. Family history of diabetes. -Perform fingerstick blood glucose test today to screen for hyperglycemia. -Reassured normal, not diabetic  Follow-up Pending results of vaginal swab and blood glucose test. Plan for Pap smear and physical in March.        Return if symptoms worsen or fail to improve.    Subjective:    Chief Complaint  Patient presents with   Vaginal Itching    Pt in office for possible yeast infection; pt recently had Covid and sinus infection, now starting to get better but was not seen, menstrual cycle was late and since cycle states discomfort since. Pt states itchy and when wiping very sore, yellow tint and excessive discharge. Bene present since 12/02/23. Took a Difluican right before period started    HPI Discussed the use of AI scribe software for clinical note transcription with the patient, who gave verbal consent to proceed.  History of Present Illness   The patient, with a past medical history of bacterial vaginosis and yeast  infection, presents with symptoms suggestive of a possible yeast infection. She reports discomfort, itching, and a yellowish, milky discharge. The patient notes that these symptoms began after a period of illness, including COVID-19 and a sinus infection, and have persisted despite taking a yeast pill before her last menstrual period. The patient also mentions a recent change in diet and hydration, as well as increased stress due to recent personal events. She has noticed changes in her body, including dark spots on her face, which she attributes to the effects of medication and illness. The patient has not been sexually active for about a month and has been trying to manage her symptoms with over-the-counter medications and lifestyle changes, including wearing cotton underwear and drinking more water.       Past Medical History:  Diagnosis Date   Chlamydia infection 06/03/2017   History of chlamydia    Pilonidal cyst     No past surgical history on file.  Family History  Problem Relation Age of Onset   Alcohol abuse Father    Diabetes Paternal Grandfather    Diabetes Paternal Grandmother    Diabetes Maternal Grandmother     Social History   Tobacco Use   Smoking status: Never   Smokeless tobacco: Never  Substance Use Topics   Alcohol use: No   Drug use: No     Allergies  Allergen Reactions   Paxlovid [Nirmatrelvir-Ritonavir] Itching and Rash    Facial Itching and breakouts    Review of Systems NEGATIVE UNLESS OTHERWISE INDICATED IN  HPI      Objective:     BP 124/76 (BP Location: Left Arm, Patient Position: Sitting, Cuff Size: Normal)   Pulse 84   Temp (!) 97.1 F (36.2 C) (Temporal)   Ht 5\' 3"  (1.6 m)   Wt 123 lb 6.4 oz (56 kg)   LMP 11/26/2023 (Exact Date)   SpO2 100%   BMI 21.86 kg/m   Wt Readings from Last 3 Encounters:  12/15/23 123 lb 6.4 oz (56 kg)  10/15/23 120 lb 12.8 oz (54.8 kg)  03/18/23 121 lb 6.1 oz (55.1 kg)    BP Readings from Last 3  Encounters:  12/15/23 124/76  10/15/23 102/66  03/18/23 110/70     Physical Exam Vitals and nursing note reviewed.  Constitutional:      Appearance: Normal appearance.  Cardiovascular:     Rate and Rhythm: Normal rate and regular rhythm.  Pulmonary:     Effort: Pulmonary effort is normal.     Breath sounds: Normal breath sounds.  Genitourinary:    Comments: Declined, pt to self-swab today  Skin:    Findings: No rash.  Neurological:     General: No focal deficit present.     Mental Status: She is alert and oriented to person, place, and time.  Psychiatric:        Mood and Affect: Mood normal.        Behavior: Behavior normal.          Terica Yogi M Napolean Sia, PA-C

## 2023-12-17 ENCOUNTER — Ambulatory Visit: Payer: Self-pay | Admitting: Physician Assistant

## 2023-12-17 ENCOUNTER — Other Ambulatory Visit: Payer: Self-pay | Admitting: Physician Assistant

## 2023-12-17 ENCOUNTER — Telehealth: Payer: Self-pay | Admitting: Physician Assistant

## 2023-12-17 LAB — CERVICOVAGINAL ANCILLARY ONLY
Bacterial Vaginitis (gardnerella): POSITIVE — AB
Candida Glabrata: NEGATIVE
Candida Vaginitis: NEGATIVE
Chlamydia: NEGATIVE
Comment: NEGATIVE
Comment: NEGATIVE
Comment: NEGATIVE
Comment: NEGATIVE
Comment: NEGATIVE
Comment: NORMAL
Neisseria Gonorrhea: NEGATIVE
Trichomonas: POSITIVE — AB

## 2023-12-17 MED ORDER — METRONIDAZOLE 500 MG PO TABS: 500.0000 mg | ORAL_TABLET | Freq: Two times a day (BID) | ORAL | 0 refills | Status: DC

## 2023-12-17 NOTE — Telephone Encounter (Signed)
Reason for Disposition . Mild widespread rash  (Exception: Heat rash lasting 3 days or less.)  Answer Assessment - Initial Assessment Questions 1. APPEARANCE of RASH: "Describe the rash." (e.g., spots, blisters, raised areas, skin peeling, scaly)     2 are raised areas that are dark with rash around, the rest are red and puffy 2. SIZE: "How big are the spots?" (e.g., tip of pen, eraser, coin; inches, centimeters)     Eraser size and bigger 3. LOCATION: "Where is the rash located?"     Face, arm, side, stomach 4. COLOR: "What color is the rash?" (Note: It is difficult to assess rash color in people with darker-colored skin. When this situation occurs, simply ask the caller to describe what they see.)     Red some more dark/purple 5. ONSET: "When did the rash begin?"     24th, dark spots started coming up in oct-and slowly have started fading 6. FEVER: "Do you have a fever?" If Yes, ask: "What is your temperature, how was it measured, and when did it start?"     denies 7. ITCHING: "Does the rash itch?" If Yes, ask: "How bad is the itch?" (Scale 1-10; or mild, moderate, severe)     mild 8. CAUSE: "What do you think is causing the rash?"     Fluconazole or paxlovid 9. MEDICINE FACTORS: "Have you started any new medicines within the last 2 weeks?" (e.g., antibiotics)      fluconazole 10. OTHER SYMPTOMS: "Do you have any other symptoms?" (e.g., dizziness, headache, sore throat, joint pain)       denies 11. PREGNANCY: "Is there any chance you are pregnant?" "When was your last menstrual period?"       Denies  Protocols used: Rash or Redness - [facility]

## 2023-12-17 NOTE — Telephone Encounter (Signed)
  Chief Complaint: rash Symptoms: mild itching Frequency: some started 6 months ago and was getting better, other portions of the rash are new starting 12/24 Pertinent Negatives: Patient denies denies fever, denies airway complications, denies difficulty swallowing, denies SOB, denies CP, denies pregnancy Disposition: [] ED /[x] Urgent Care (no appt availability in office) / [] Appointment(In office/virtual)/ []  [facility]/ [] Home Care/ [x] Refused Recommended Disposition /[]  Mobile Bus/ []  Follow-up with PCP Additional Notes: Schedule was full today for pts home PCP office.  Attempt was made to schedule at one other office, no appts at that location as well. Pt was advised that UC/VV was an option but pt declined d/t cost.  Pt asked about dosing of benadryl, stated her last and only dose was yesterday, she took 25mg , stated that her rash improved.  Pt asked how much benadryl she should take advised that she could take 25-50mg  every 4-6 hrs if she needed.  Advised to incorporate an OTC 24 hr antihistamine. Advised that any airway distress, or increase in s/s warrants an UC/ED visit. Pt agreeable. At this time phone was disconnected. Multiple attempts were made to call pt back, no answer. VM was left, stating to call back and that also an appt had now opened for this afternoon if she could make the appt to call us back ASAP.   Reason for Disposition  Mild widespread rash  (Exception: Heat rash lasting 3 days or less.)  Answer Assessment - Initial Assessment Questions 1. APPEARANCE of RASH: "Describe the rash." (e.g., spots, blisters, raised areas, skin peeling, scaly)     2 are raised areas that are dark with rash around, the rest are red and puffy 2. SIZE: "How big are the spots?" (e.g., tip of pen, eraser, coin; inches, centimeters)     Eraser size and bigger 3. LOCATION: "Where is the rash located?"     Face, arm, side, stomach 4. COLOR: "What color is the rash?" (Note: It is  difficult to assess rash color in people with darker-colored skin. When this situation occurs, simply ask the caller to describe what they see.)     Red some more dark/purple 5. ONSET: "When did the rash begin?"     24th, dark spots started coming up in oct-and slowly have started fading 6. FEVER: "Do you have a fever?" If Yes, ask: "What is your temperature, how was it measured, and when did it start?"     denies 7. ITCHING: "Does the rash itch?" If Yes, ask: "How bad is the itch?" (Scale 1-10; or mild, moderate, severe)     mild 8. CAUSE: "What do you think is causing the rash?"     Fluconazole or paxlovid 9. MEDICINE FACTORS: "Have you started any new medicines within the last 2 weeks?" (e.g., antibiotics)      fluconazole 10. OTHER SYMPTOMS: "Do you have any other symptoms?" (e.g., dizziness, headache, sore throat, joint pain)       denies 11. PREGNANCY: "Is there any chance you are pregnant?" "When was your last menstrual period?"       Denies  Protocols used: Rash or Redness - [facility]

## 2023-12-17 NOTE — Telephone Encounter (Signed)
Copied from CRM [id number]. Topic: Clinical - Red Word Triage >> Dec 17, 2023 12:58 PM Kathryne Eriksson wrote: Patient stated she took [facility] on December 24th and is currently experiencing some sort of allergic reaction.

## 2023-12-17 NOTE — Telephone Encounter (Signed)
At 1258 RN spoke with pt on callback.  Pt advised to use hydrocortisone cream and 24 hr antihistamine per the care advice.  Advised pt that if she is not improving by tomorrow to call back.  Pt agreeable.

## 2023-12-18 ENCOUNTER — Ambulatory Visit: Payer: BC Managed Care – PPO | Admitting: Physician Assistant

## 2023-12-18 ENCOUNTER — Encounter: Payer: Self-pay | Admitting: Physician Assistant

## 2023-12-18 VITALS — BP 114/72 | HR 72 | Temp 97.9°F | Ht 63.0 in | Wt 123.6 lb

## 2023-12-18 DIAGNOSIS — T50905A Adverse effect of unspecified drugs, medicaments and biological substances, initial encounter: Secondary | ICD-10-CM | POA: Diagnosis not present

## 2023-12-18 DIAGNOSIS — Z202 Contact with and (suspected) exposure to infections with a predominantly sexual mode of transmission: Secondary | ICD-10-CM | POA: Diagnosis not present

## 2023-12-18 DIAGNOSIS — A599 Trichomoniasis, unspecified: Secondary | ICD-10-CM

## 2023-12-18 NOTE — Progress Notes (Signed)
Patient ID: Olivia Herrera, female    DOB: April 06, 1996, 27 y.o.   MRN: [id number]   Assessment & Plan:  Trichomoniasis  Exposure to STD -     HIV Antibody (routine testing w rflx) -     RPR  Adverse effect of drug, initial encounter    Assessment and Plan    Trichomonas Vaginitis and Bacterial Vaginosis Positive test results, currently on Flagyl. Patient reports feeling better since starting medication. -Continue Flagyl as prescribed. -Plan for recheck in two months with vaginal swab.  Drug Reaction to Fluconazole Developed rash on face, arms, and torso after taking Fluconazole. Rash improved with Benadryl and Allegra. -Add Fluconazole to allergy list. -Continue Allegra daily for the next week while finishing Flagyl. -Use cortisone cream on rash as needed.  General Health Maintenance -Order HIV and Syphilis tests as part of comprehensive STI screening. -Encourage patient to reach out if any changes or concerns arise.          Return in about 2 months (around 02/18/2024) for LAB ONLY - G/C/T URINE OR VAGINAL SWAB RECHECK .    Subjective:    Chief Complaint  Patient presents with   Rash    Possible to diflucan  Rash on body     Rash   Discussed the use of AI scribe software for clinical note transcription with the patient, who gave verbal consent to proceed.  History of Present Illness   Rexanna, a patient with a recent history of COVID-19, presents with a rash that she believes is a reaction to fluconazole, which was prescribed for a suspected yeast infection. The rash started the day after taking fluconazole and is characterized by dark spots with redness around them, primarily on the face, arms, and torso. The rash was itchy initially but has since improved. She also reports a history of sinus inflammation and mucus production post-COVID-19, which has since resolved.  In addition to the rash, she is currently being treated for bacterial vaginosis and  trichomoniasis with metronidazole. She reports feeling better since starting the medication and has not noticed any adverse reactions. She expresses relief at having a definitive diagnosis for her symptoms, which she had been experiencing for about a month. She also mentions a desire to avoid similar situations in the future.       Past Medical History:  Diagnosis Date   Chlamydia infection 06/03/2017   History of chlamydia    Pilonidal cyst     History reviewed. No pertinent surgical history.  Family History  Problem Relation Age of Onset   Alcohol abuse Father    Diabetes Paternal Grandfather    Diabetes Paternal Grandmother    Diabetes Maternal Grandmother     Social History   Tobacco Use   Smoking status: Never   Smokeless tobacco: Never  Substance Use Topics   Alcohol use: No   Drug use: No     Allergies  Allergen Reactions   Fluconazole Rash    Facial rash, arms and torso; itching   Paxlovid [Nirmatrelvir-Ritonavir] Itching and Rash    Facial Itching and breakouts    Review of Systems  Skin:  Positive for rash.   NEGATIVE UNLESS OTHERWISE INDICATED IN HPI      Objective:     BP 114/72   Pulse 72   Temp 97.9 F (36.6 C) (Temporal)   Ht 5\' 3"  (1.6 m)   Wt 123 lb 9.6 oz (56.1 kg)   LMP 11/26/2023 (Exact Date)  SpO2 100%   BMI 21.89 kg/m   Wt Readings from Last 3 Encounters:  12/18/23 123 lb 9.6 oz (56.1 kg)  12/15/23 123 lb 6.4 oz (56 kg)  10/15/23 120 lb 12.8 oz (54.8 kg)    BP Readings from Last 3 Encounters:  12/18/23 114/72  12/15/23 124/76  10/15/23 102/66     Physical Exam Vitals and nursing note reviewed.  Constitutional:      Appearance: Normal appearance.  Cardiovascular:     Rate and Rhythm: Normal rate and regular rhythm.  Pulmonary:     Effort: Pulmonary effort is normal.     Breath sounds: Normal breath sounds.  Neurological:     General: No focal deficit present.     Mental Status: She is alert and oriented to person,  place, and time.  Psychiatric:        Mood and Affect: Mood normal.     R forearm lesion as pictured below:         Hallis Meditz M Tiffancy Moger, PA-C

## 2023-12-19 LAB — RPR: RPR Ser Ql: NONREACTIVE

## 2023-12-19 LAB — HIV ANTIBODY (ROUTINE TESTING W REFLEX): HIV 1&2 Ab, 4th Generation: NONREACTIVE

## 2024-02-12 ENCOUNTER — Telehealth: Payer: Self-pay | Admitting: *Deleted

## 2024-02-12 DIAGNOSIS — Z202 Contact with and (suspected) exposure to infections with a predominantly sexual mode of transmission: Secondary | ICD-10-CM

## 2024-02-12 NOTE — Telephone Encounter (Signed)
 Please see message.

## 2024-02-12 NOTE — Telephone Encounter (Signed)
 Copied from CRM [id number]. Topic: Clinical - Request for Lab/Test Order >> Feb 12, 2024 11:43 AM [name] wrote: Reason for CRM: Patient is asking if a full blood panel for STD check can be ordered for her to complete with her other lab checks on 02/18/24.

## 2024-02-16 ENCOUNTER — Encounter: Payer: Self-pay | Admitting: *Deleted

## 2024-02-16 NOTE — Addendum Note (Signed)
 Addended by: Jimmye Norman on: 02/16/2024 07:11 AM   Modules accepted: Orders

## 2024-02-18 ENCOUNTER — Other Ambulatory Visit: Payer: 59

## 2024-02-18 ENCOUNTER — Other Ambulatory Visit (HOSPITAL_COMMUNITY)
Admission: RE | Admit: 2024-02-18 | Discharge: 2024-02-18 | Disposition: A | Discharge status: Home / Self Care | Payer: 59 | Source: Ambulatory Visit | Attending: Physician Assistant | Admitting: Physician Assistant

## 2024-02-18 DIAGNOSIS — Z202 Contact with and (suspected) exposure to infections with a predominantly sexual mode of transmission: Secondary | ICD-10-CM

## 2024-02-19 ENCOUNTER — Encounter: Payer: Self-pay | Admitting: Physician Assistant

## 2024-02-19 LAB — URINE CYTOLOGY ANCILLARY ONLY
Chlamydia: NEGATIVE
Comment: NEGATIVE
Comment: NEGATIVE
Comment: NORMAL
Neisseria Gonorrhea: NEGATIVE
Trichomonas: NEGATIVE

## 2024-02-19 LAB — RPR: RPR Ser Ql: NONREACTIVE

## 2024-02-19 LAB — HIV ANTIBODY (ROUTINE TESTING W REFLEX): HIV 1&2 Ab, 4th Generation: NONREACTIVE

## 2024-06-13 ENCOUNTER — Other Ambulatory Visit (HOSPITAL_COMMUNITY)
Admission: RE | Admit: 2024-06-13 | Discharge: 2024-06-13 | Disposition: A | Discharge status: Home / Self Care | Source: Ambulatory Visit | Attending: Family Medicine | Admitting: Family Medicine

## 2024-06-13 ENCOUNTER — Ambulatory Visit (INDEPENDENT_AMBULATORY_CARE_PROVIDER_SITE_OTHER): Payer: Self-pay | Admitting: Family Medicine

## 2024-06-13 ENCOUNTER — Encounter: Payer: Self-pay | Admitting: Family Medicine

## 2024-06-13 VITALS — BP 109/71 | HR 77 | Temp 98.1°F | Resp 18 | Ht 63.0 in | Wt 127.5 lb

## 2024-06-13 DIAGNOSIS — R350 Frequency of micturition: Secondary | ICD-10-CM | POA: Diagnosis present

## 2024-06-13 DIAGNOSIS — R8290 Unspecified abnormal findings in urine: Secondary | ICD-10-CM

## 2024-06-13 LAB — POC URINALSYSI DIPSTICK (AUTOMATED)
Bilirubin, UA: NEGATIVE
Blood, UA: POSITIVE
Glucose, UA: NEGATIVE
Ketones, UA: NEGATIVE
Leukocytes, UA: NEGATIVE
Nitrite, UA: NEGATIVE
Protein, UA: POSITIVE — AB
Spec Grav, UA: 1.025 (ref 1.010–1.025)
Urobilinogen, UA: 0.2 U/dL
pH, UA: 6 (ref 5.0–8.0)

## 2024-06-13 MED ORDER — CEPHALEXIN 500 MG PO CAPS: 500.0000 mg | ORAL_CAPSULE | Freq: Two times a day (BID) | ORAL | 0 refills | Status: AC

## 2024-06-13 NOTE — Progress Notes (Signed)
 Subjective:     Patient ID: Olivia Herrera, female    DOB: 11/13/1996, 28 y.o.   MRN: [id number]  Chief Complaint  Patient presents with   Urinary Frequency    Urinary frequency, but doesn't put out much urine Sx started Sunday   Cloudy Urine    Cloudy urine with some irritation after urination    Hematuria    Noticed today, but due for menses    HPI Discussed the use of AI scribe software for clinical note transcription with the patient, who gave verbal consent to proceed.  History of Present Illness Olivia Herrera is a 28 year old female who presents with urinary symptoms including frequency, urgency, and dysuria.  Urinary symptoms began on Sunday, June 12, 2024, characterized by frequency, urgency, and dysuria. Urine is described as cloudy with a burning sensation post-urination. No fever is present, but there is some abdominal discomfort that improves with increased water intake. Mild back discomfort is also noted.  She has not had frequent urinary tract infections in the past. Previous urinary issues were associated with sexual activity with less trusted partners, but she is not experiencing such problems currently. She distinguishes her current symptoms from bacterial vaginosis, which she has had before, noting that this feels more like a urinary issue.  She is not using any birth control and is not actively trying to conceive. She has been sexually active without using preventive measures.   She reports not drinking water or eating on Sunday due to stress from being at home with children, leading to increased body tension. Symptoms improved with increased water intake and relaxation.    There are no preventive care reminders to display for this patient.  Past Medical History:  Diagnosis Date   Chlamydia infection 06/03/2017   History of chlamydia    Pilonidal cyst     History reviewed. No pertinent surgical history.   Current Outpatient Medications:     cephALEXin  (KEFLEX ) 500 MG capsule, Take 1 capsule (500 mg total) by mouth 2 (two) times daily for 7 days. Take for 7 days, Disp: 14 capsule, Rfl: 0  Allergies  Allergen Reactions   Fluconazole  Rash    Facial rash, arms and torso; itching   Paxlovid  [Nirmatrelvir -Ritonavir ] Itching and Rash    Facial Itching and breakouts   ROS neg/noncontributory except as noted HPI/below      Objective:     BP 109/71   Pulse 77   Temp 98.1 F (36.7 C) (Temporal)   Resp 18   Ht 5' 3 (1.6 m)   Wt 127 lb 8 oz (57.8 kg)   LMP 05/15/2024 (Exact Date)   SpO2 100%   BMI 22.59 kg/m  Wt Readings from Last 3 Encounters:  06/13/24 127 lb 8 oz (57.8 kg)  12/18/23 123 lb 9.6 oz (56.1 kg)  12/15/23 123 lb 6.4 oz (56 kg)    Physical Exam   Gen: WDWN NAD HEENT: NCAT, conjunctiva not injected, sclera nonicteric ABDOMEN:  BS+, soft, NTND, No HSM, no masses. No cvat EXT:  no edema MSK: no gross abnormalities.  NEURO: A&O x3.  CN II-XII intact.  PSYCH: normal mood. Good eye contact Results for orders placed or performed in visit on 06/13/24  POCT Urinalysis Dipstick (Automated)   Collection Time: 06/13/24 11:12 AM  Result Value Ref Range   Color, UA YELLOW    Clarity, UA CLOUDY    Glucose, UA Negative Negative   Bilirubin, UA NEG    Ketones,  UA NEG    Spec Grav, UA 1.025 1.010 - 1.025   Blood, UA POSITIVE    pH, UA 6.0 5.0 - 8.0   Protein, UA Positive (A) Negative   Urobilinogen, UA 0.2 0.2 or 1.0 E.U./dL   Nitrite, UA NEG    Leukocytes, UA Negative Negative   Blood may be from menses about to start    Assessment & Plan:  Cloudy urine -     POCT Urinalysis Dipstick (Automated)  Frequent urination -     POCT Urinalysis Dipstick (Automated) -     Cervicovaginal ancillary only -     Urine Culture  Other orders -     Cephalexin ; Take 1 capsule (500 mg total) by mouth 2 (two) times daily for 7 days. Take for 7 days  Dispense: 14 capsule; Refill: 0  Assessment and Plan Assessment &  Plan Urinary Tract Infection (UTI)   She presents with acute urinary symptoms since Sunday, including cloudy urine, increased frequency with low volume, and post-urination burning. Mild lower abdominal discomfort improves with increased water intake. Symptoms suggest a UTI, with differential diagnosis including bacterial vaginosis and yeast infection. No fever or significant back pain. Uncertain pregnancy status influences medication choice. Informed consent obtained for Keflex , which is safe in pregnancy, and decision made to avoid bladder anesthetizing medication due to potential pregnancy risk. Prescribe Keflex  (cephalexin ) for UTI. Advise increased water intake to aid infection clearance. Discuss optional use of cranberry juice to acidify urine. Perform self-swab for bacterial vaginosis, yeast, chlamydia, and gonorrhea per pt reques     Return if symptoms worsen or fail to improve.  Olivia CHRISTELLA Carrel, MD

## 2024-06-13 NOTE — Patient Instructions (Signed)
 An antibiotic has been sent to your pharmacy, start today. We will call or notify you via MyChart if the urine culture indicates a different antibiotic to be used. Drink at least 2 liters = 64oz = 8 cups of water daily.

## 2024-06-14 ENCOUNTER — Telehealth: Payer: Self-pay

## 2024-06-14 ENCOUNTER — Ambulatory Visit: Payer: Self-pay | Admitting: Family Medicine

## 2024-06-14 LAB — CERVICOVAGINAL ANCILLARY ONLY
Bacterial Vaginitis (gardnerella): POSITIVE — AB
Candida Glabrata: NEGATIVE
Candida Vaginitis: NEGATIVE
Chlamydia: NEGATIVE
Comment: NEGATIVE
Comment: NEGATIVE
Comment: NEGATIVE
Comment: NEGATIVE
Comment: NEGATIVE
Comment: NORMAL
Neisseria Gonorrhea: NEGATIVE
Trichomonas: NEGATIVE

## 2024-06-14 NOTE — Telephone Encounter (Signed)
 Copied from CRM [id number]. Topic: Clinical - Lab/Test Results >> Jun 13, 2024  4:51 PM Delon T wrote: Reason for CRM: calling for results- asking if a yeast pill to take after meds are finished- [phone]

## 2024-06-14 NOTE — Progress Notes (Signed)
 Has bv-send metronidazole  500mg  bid x 7days

## 2024-06-15 ENCOUNTER — Other Ambulatory Visit: Payer: Self-pay | Admitting: *Deleted

## 2024-06-15 LAB — URINE CULTURE
MICRO NUMBER:: 16612625
SPECIMEN QUALITY:: ADEQUATE

## 2024-06-15 MED ORDER — METRONIDAZOLE 500 MG PO TABS: 500.0000 mg | ORAL_TABLET | Freq: Two times a day (BID) | ORAL | 0 refills | Status: AC

## 2024-06-15 NOTE — Progress Notes (Signed)
 Urine should be sensitive to the keflex -finish it

## 2024-11-24 ENCOUNTER — Other Ambulatory Visit (HOSPITAL_COMMUNITY)
Admission: RE | Admit: 2024-11-24 | Discharge: 2024-11-24 | Disposition: A | Source: Ambulatory Visit | Attending: Family Medicine | Admitting: Family Medicine

## 2024-11-24 ENCOUNTER — Ambulatory Visit: Admitting: Family Medicine

## 2024-11-24 ENCOUNTER — Ambulatory Visit: Payer: Self-pay

## 2024-11-24 ENCOUNTER — Encounter: Payer: Self-pay | Admitting: Family Medicine

## 2024-11-24 VITALS — BP 116/70 | HR 81 | Temp 98.5°F | Ht 63.0 in | Wt 142.2 lb

## 2024-11-24 DIAGNOSIS — N898 Other specified noninflammatory disorders of vagina: Secondary | ICD-10-CM | POA: Diagnosis present

## 2024-11-24 DIAGNOSIS — B3731 Acute candidiasis of vulva and vagina: Secondary | ICD-10-CM | POA: Diagnosis not present

## 2024-11-24 DIAGNOSIS — M545 Low back pain, unspecified: Secondary | ICD-10-CM

## 2024-11-24 LAB — POCT URINALYSIS DIP (CLINITEK)
Bilirubin, UA: NEGATIVE
Glucose, UA: NEGATIVE mg/dL
Ketones, POC UA: NEGATIVE mg/dL
Nitrite, UA: NEGATIVE
Spec Grav, UA: 1.025 (ref 1.010–1.025)
Urobilinogen, UA: 0.2 U/dL
pH, UA: 6 (ref 5.0–8.0)

## 2024-11-24 MED ORDER — LIDOPRO 4-4-5 % EX PTCH: 1.0000 | MEDICATED_PATCH | Freq: Two times a day (BID) | CUTANEOUS | 2 refills | Status: AC | PRN

## 2024-11-24 NOTE — Telephone Encounter (Signed)
 Noted, pt scheduled @ [facility]
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
## 2024-11-24 NOTE — Patient Instructions (Addendum)
 Labs have been sent in. We will be in contact with results once they are received.   I have refilled the Lidopro patches for your back.  Continue to stay well hydrated.

## 2024-11-25 ENCOUNTER — Telehealth: Payer: Self-pay

## 2024-11-25 ENCOUNTER — Other Ambulatory Visit: Payer: Self-pay

## 2024-11-25 ENCOUNTER — Ambulatory Visit: Payer: Self-pay | Admitting: Family Medicine

## 2024-11-25 LAB — CERVICOVAGINAL ANCILLARY ONLY
Bacterial Vaginitis (gardnerella): NEGATIVE
Candida Glabrata: NEGATIVE
Candida Vaginitis: POSITIVE — AB
Chlamydia: NEGATIVE
Comment: NEGATIVE
Comment: NEGATIVE
Comment: NEGATIVE
Comment: NEGATIVE
Comment: NEGATIVE
Comment: NORMAL
Neisseria Gonorrhea: NEGATIVE
Trichomonas: NEGATIVE

## 2024-11-25 LAB — URINE CULTURE: Result:: NO GROWTH

## 2024-11-25 NOTE — Telephone Encounter (Signed)
 Spoke with patient.

## 2024-11-25 NOTE — Telephone Encounter (Signed)
 Copied from CRM [id number]. Topic: Clinical - Lab/Test Results >> Nov 25, 2024  1:39 PM Paige D wrote: Reason for CRM: Pt calling back to speak to miss Wilford in regards to some test results. Please call pt back

## 2024-11-28 ENCOUNTER — Telehealth: Payer: Self-pay

## 2024-11-28 ENCOUNTER — Other Ambulatory Visit: Payer: Self-pay

## 2024-11-28 ENCOUNTER — Other Ambulatory Visit: Payer: Self-pay | Admitting: Family Medicine

## 2024-11-28 ENCOUNTER — Other Ambulatory Visit (HOSPITAL_COMMUNITY): Payer: Self-pay

## 2024-11-28 DIAGNOSIS — B3731 Acute candidiasis of vulva and vagina: Secondary | ICD-10-CM

## 2024-11-28 MED ORDER — CLOTRIMAZOLE 1 % EX CREA: 1.0000 | TOPICAL_CREAM | Freq: Two times a day (BID) | CUTANEOUS | 0 refills | Status: DC

## 2024-11-28 NOTE — Telephone Encounter (Signed)
 Pharmacy Patient Advocate Encounter   Received notification from Physician's Office that prior authorization for LidoPro 4-4-5% patches  is required/requested.   Insurance verification completed.   The patient is insured through [facility].   Per test claim: PA required; PA submitted to above mentioned insurance via Latent Key/confirmation #/EOC [id number] Status is pending

## 2024-11-28 NOTE — Telephone Encounter (Signed)
 Copied from CRM #[id number]. Topic: Clinical - Prescription Issue >> Nov 28, 2024  8:24 AM Aleatha BROCKS wrote: Reason for CRM: Patient said that [id number] - [place], [place] - [address] AT [facility] OF [address] & [facility] [address] has not received her prescription that was prescribe last Thursday 12/4 for a cream for yeast infection and the patches that was prescribe needs a pre authorization from insurance and 5% and not over counter 4%, patient would like a call back once it is done or any update on how it will go

## 2024-11-28 NOTE — Telephone Encounter (Signed)
 Spoke with patient, she needs a PA for the 5% Lidocaine patches. Will send to PA team, yeast cream sent in

## 2024-11-28 NOTE — Telephone Encounter (Signed)
 PA request has been Started. New Encounter has been or will be created for follow up. For additional info see Pharmacy Prior Auth telephone encounter from 11/28/2024.

## 2024-11-28 NOTE — Telephone Encounter (Signed)
 Copied from CRM #[id number]. Topic: Clinical - Prescription Issue >> Nov 28, 2024  4:26 PM Alfonso ORN wrote: Reason for CRM: pharmacy not seeing rx for LidoPro 4-4-5% patches    and said all advised pt rx needs to be Lidocaine patch 5%  and not LidoPro 4-4-5% patches to fill. Lidocaine patch 5% will need PA as well. Please contact to advise

## 2024-11-29 ENCOUNTER — Telehealth: Payer: Self-pay

## 2024-11-29 NOTE — Telephone Encounter (Signed)
 Copied from CRM #[id number]. Topic: Clinical - Medication Question >> Nov 29, 2024 10:43 AM Carlyon D wrote: Reason for CRM: Pt calling in regards to medication she was given. clotrimazole  (LOTRIMIN ) 1 % cream she has not taken it yet? Pt needs to know what she should do as in applying it and would like a call back

## 2024-11-29 NOTE — Telephone Encounter (Signed)
 Please see msg regarding Rx for patient and advise

## 2024-11-30 NOTE — Telephone Encounter (Signed)
 Please see pt response and advise for patient

## 2024-11-30 NOTE — Telephone Encounter (Unsigned)
 Copied from CRM #[id number]. Topic: Clinical - Medication Question >> Nov 30, 2024  2:07 PM Aisha D wrote: Reason for CRM: Pt is requesting to speak with Corean Ku, NP, or her nurse in regards to the clotrimazole  (LOTRIMIN ) 1 % cream. Pt stated that she is being told 2 different instructions on how to apply the cream and how long she needs to use it. Pt also wants to make sure she has the correct medication and would like a callback today if possible.

## 2024-12-01 ENCOUNTER — Other Ambulatory Visit: Payer: Self-pay | Admitting: Family Medicine

## 2024-12-01 ENCOUNTER — Other Ambulatory Visit: Payer: Self-pay

## 2024-12-01 ENCOUNTER — Other Ambulatory Visit (HOSPITAL_COMMUNITY): Payer: Self-pay

## 2024-12-01 DIAGNOSIS — B3731 Acute candidiasis of vulva and vagina: Secondary | ICD-10-CM

## 2024-12-01 MED ORDER — CLOTRIMAZOLE 3 2 % VA CREA: 1.0000 | TOPICAL_CREAM | Freq: Every day | VAGINAL | 0 refills | Status: DC

## 2024-12-01 MED ORDER — CLOTRIMAZOLE 3 2 % VA CREA: 1.0000 | TOPICAL_CREAM | Freq: Every day | VAGINAL | 0 refills | Status: AC

## 2024-12-01 NOTE — Telephone Encounter (Signed)
 Clinical questions answered and PA submitted.

## 2024-12-01 NOTE — Telephone Encounter (Signed)
 Spoke with patient, gave a verbal understanding. She will see if pharmacy will allow her to pay OOP for patches.

## 2024-12-01 NOTE — Telephone Encounter (Signed)
 Pharmacy Patient Advocate Encounter  Received notification from [facility] that Prior Authorization for LidoPro 4-4-5% patches has been DENIED.  See denial reason below. No denial letter attached in CMM. Will attach denial letter to Media tab once received.   PA #/Case ID/Reference #: [id number]

## 2024-12-08 ENCOUNTER — Telehealth: Payer: Self-pay

## 2024-12-08 ENCOUNTER — Telehealth: Payer: Self-pay | Admitting: Physician Assistant

## 2024-12-08 NOTE — Telephone Encounter (Signed)
 Copied from CRM #[id number]. Topic: Clinical - Prescription Issue >> Nov 28, 2024  8:24 AM Aleatha BROCKS wrote: Reason for CRM: Patient said that [id number] - [place], [place] - [address] AT [facility] OF [address] & [facility] [address] has not received her prescription that was prescribe last Thursday 12/4 for a cream for yeast infection and the patches that was prescribe needs a pre authorization from insurance and 5% and not over counter 4%, patient would like a call back once it is done or any update on how it will go >> Dec 08, 2024  3:22 PM Robinson H wrote: Patient following up on Methyl Salicylate-Lido-Menthol (LIDOPRO) 4-4-5 % PTCH states that pharmacy needs more information from provider Corean since they are unclear on the prescription that was sent, patient aware insurance denied the prior authorization for medication and she just needs the prescription sent to the pharmacy and will pay out of pocket.  [place] [phone]

## 2024-12-08 NOTE — Telephone Encounter (Signed)
 Pt seen Olivia Herrera at [facility] office on 11/24/24 and was advised needs to contact prescriber regarding medication. Please see pt msg and advise if there are further recommendations

## 2024-12-08 NOTE — Telephone Encounter (Signed)
 Copied from CRM #[id number]. Topic: General - Billing Inquiry >> Dec 08, 2024  3:11 PM Robinson H wrote: Reason for CRM: Patient states she was in on 12/4 to see Corean and had labs done, states she was told by Corean that it wouldn't be an up charge to perform the additional swabs. Patient states she did receive a bill for the additional labs.  Andria [phone]

## 2024-12-09 NOTE — Telephone Encounter (Signed)
 Noted and agreed, thank you.

## 2024-12-16 ENCOUNTER — Ambulatory Visit: Payer: Self-pay

## 2024-12-16 NOTE — Telephone Encounter (Signed)
 FYI Only or Action Required?: FYI only for provider: Home care advised.  Patient was last seen in primary care on 11/24/2024 by Alvia Corean CROME, FNP.  Called Nurse Triage reporting Headache, Chills, and Cough.  Symptoms began yesterday.  Interventions attempted: Rest, hydration, or home remedies.  Symptoms are: unchanged.  Triage Disposition: Home Care  Patient/caregiver understands and will follow disposition?: Yes  Reason for Disposition  Common cold with no complications  Answer Assessment - Initial Assessment Questions Patient reports headache, cough, runny nose, chest pain with cough, chills, and vomiting when trying to cough up mucus starting yesterday. Unsure if she has been febrile. Home care advised. Advised to call back if symptoms worsen.   1. ONSET: When did the nasal discharge start?      Yesterday  2. AMOUNT: How much discharge is there?      Small amount  3. COUGH: Do you have a cough? If Yes, ask: Describe the color of your mucus. (e.g., clear, white, yellow, green)     Yes, unsure of color of mucus  4. RESPIRATORY DISTRESS: Describe your breathing.      Denies SOB  5. FEVER: Do you have a fever? If Yes, ask: What is your temperature, how was it measured, and when did it start?     Unsure, states that she has had chills  6. SEVERITY: Overall, how bad are you feeling right now? (e.g., doesn't interfere with normal activities, staying home from school/work, staying in bed)      Staying home  7. OTHER SYMPTOMS: Do you have any other symptoms? (e.g., earache, mouth sores, sore throat, wheezing)     Sore throat yesterday, vomiting, headache, chest ache with coughing, chills  8. PREGNANCY: Is there any chance you are pregnant? When was your last menstrual period?     No  Protocols used: Common Cold-A-AH  Copied from CRM #[id number]. Topic: Clinical - Red Word Triage >> Dec 16, 2024  1:06 PM Rea ORN wrote: Red Word that prompted  transfer to Nurse Triage: bad headache, vomit, chest ache, chills, bad productive cough

## 2024-12-16 NOTE — Telephone Encounter (Signed)
 Please review and advise.
